# Patient Record
Sex: Male | Born: 2000 | Race: White | Hispanic: No | Marital: Single | Smoking: Never smoker
Health system: Southern US, Community
[De-identification: ages and names within clinical notes are randomized; demographics above are authoritative.]

## PROBLEM LIST (undated history)

## (undated) DIAGNOSIS — J45909 Unspecified asthma, uncomplicated: Secondary | ICD-10-CM

## (undated) DIAGNOSIS — M1990 Unspecified osteoarthritis, unspecified site: Secondary | ICD-10-CM

## (undated) HISTORY — DX: Unspecified asthma, uncomplicated: J45.909

---
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
## 2021-01-09 ENCOUNTER — Encounter: Payer: Self-pay | Admitting: Allergy

## 2021-01-09 ENCOUNTER — Other Ambulatory Visit: Payer: Self-pay

## 2021-01-09 ENCOUNTER — Ambulatory Visit (INDEPENDENT_AMBULATORY_CARE_PROVIDER_SITE_OTHER): Payer: BC Managed Care – PPO | Admitting: Allergy

## 2021-01-09 VITALS — BP 108/82 | HR 72 | Resp 16

## 2021-01-09 DIAGNOSIS — H6982 Other specified disorders of Eustachian tube, left ear: Secondary | ICD-10-CM | POA: Diagnosis not present

## 2021-01-09 DIAGNOSIS — L2089 Other atopic dermatitis: Secondary | ICD-10-CM | POA: Diagnosis not present

## 2021-01-09 DIAGNOSIS — J302 Other seasonal allergic rhinitis: Secondary | ICD-10-CM

## 2021-01-09 DIAGNOSIS — J4530 Mild persistent asthma, uncomplicated: Secondary | ICD-10-CM | POA: Diagnosis not present

## 2021-01-09 DIAGNOSIS — J3089 Other allergic rhinitis: Secondary | ICD-10-CM | POA: Diagnosis not present

## 2021-01-09 MED ORDER — QVAR REDIHALER 80 MCG/ACT IN AERB: INHALATION_SPRAY | RESPIRATORY_TRACT | 5 refills | Status: DC

## 2021-01-09 NOTE — Progress Notes (Signed)
Follow-up Note  RE: Jeffrey Duran MRN: [id number] DOB: 04-26-2001 Date of Office Visit: 01/09/2021   History of present illness: Jeffrey Duran is a 20 y.o. male presenting today for ear issues.  He was last in the office on 11/09/2020 by Dr. Dellis Anes for asthma, allergic rhinitis and eczema.  He states he has been having issues with his ears being stopped up and states can hear popping.  He feels it mostly in his left ear although occasionally involves the right ear.  Sometimes he states he can have ear pain and extends to his jaw.   He states the symptoms get better and worse.  He states symptoms ongoing for past 3 months.  He did do a "doc on demand" telemedicine visit for and went to an UC and both times got antibiotic and prednisone.  The ear symptoms did improve with use but returned after completion.  He does use nasal atrovent 1 spray each nostril twice a day and does seem to help some.  He thinks he was on flonase several years ago.   He has not seen ENT for these issues.   He also states is using albuterol about 2-3 times a week for chest tightness.  He takes qvar 40 2 puffs twice a day.  He will use triamcinolone as needed for eczema flare with good improvement.      Review of systems: Review of Systems  Constitutional: Negative.   HENT: Positive for ear pain. Negative for ear discharge, hearing loss and tinnitus.   Eyes: Negative.   Respiratory: Negative.   Cardiovascular: Negative.   Gastrointestinal: Negative.   Musculoskeletal: Negative.   Skin: Negative.   Neurological: Negative.     All other systems negative unless noted above in HPI  Past medical/social/surgical/family history have been reviewed and are unchanged unless specifically indicated below.  No changes  Medication List: Current Outpatient Medications  Medication Sig Dispense Refill  . albuterol (PROVENTIL) (2.5 MG/3ML) 0.083% nebulizer solution 2.5 mg.    . albuterol (VENTOLIN HFA) 108 (90 Base)  MCG/ACT inhaler Inhale 1-2 puffs into the lungs every 4 (four) hours as needed for wheezing or shortness of breath. 18 g 2  . cetirizine (ZYRTEC) 10 MG tablet Take 1 tablet (10 mg total) by mouth daily. 30 tablet 5  . ipratropium (ATROVENT) 0.06 % nasal spray Place 2 sprays into both nostrils 3 (three) times daily. 15 mL 5  . montelukast (SINGULAIR) 10 MG tablet TAKE 1 TABLET(10 MG) BY MOUTH DAILY 30 tablet 5  . triamcinolone ointment (KENALOG) 0.1 % Apply to affected area except face 2 times daily     No current facility-administered medications for this visit.     Known medication allergies: Allergies  Allergen Reactions  . Povidone Iodine Other (See Comments)     Physical examination: Blood pressure 108/82, pulse 72, resp. rate 16, SpO2 97 %.  General: Alert, interactive, in no acute distress. HEENT: PERRLA, TMs pearly gray, turbinates moderately edematous without discharge, post-pharynx non erythematous. Neck: Supple without lymphadenopathy. Lungs: Clear to auscultation without wheezing, rhonchi or rales. {no increased work of breathing. CV: Normal S1, S2 without murmurs. Abdomen: Nondistended, nontender. Skin: Warm and dry, without lesions or rashes. Extremities:  No clubbing, cyanosis or edema. Neuro:   Grossly intact.  Diagnositics/Labs: None today  Assessment and plan:   Eustachian tube dysfunction -Increase your nasal ipratropium nasal spray to 2 sprays in the morning, midday and at night until your symptoms have improved.  Once better  he can go back to twice a day use -Recommend performing nasal saline rinse at least once a day at this time.  Use this prior to using your nasal spray -If symptoms persist then you may need an ENT evaluation  Mild persistent asthma -At this time you are needing to use your albuterol a little too much to have good control. -Change Qvar 40 mcg to Qvar 80 mcg 2 puffs twice a day have access to albuterol inhaler 2 puffs every 4-6 hours as  needed for cough/wheeze/shortness of breath/chest tightness.  May use 15-20 minutes prior to activity.   Monitor frequency of use.    Asthma control goals:   Full participation in all desired activities (may need albuterol before activity)  Albuterol use two time or less a week on average (not counting use with activity)  Cough interfering with sleep two time or less a month  Oral steroids no more than once a year  No hospitalizations  Seasonal and perennial allergic rhinitis -Continue avoidance measures for grasses, weeds, trees, indoor molds, outdoor molds, dust mites, cat and dog - Continue with:  nasal ipratropium as above Singulair (montelukast) 10mg  daily Zyrtec (cetirizine) 10mg  tablet once daily - You can use an extra dose of the antihistamine, if needed, for breakthrough symptoms.  - Consider nasal saline rinses 1-2 times daily to remove allergens from the nasal cavities as well as help with mucous clearance (this is especially helpful to do before the nasal sprays are given) - Consider allergy shots as a means of long-term control. - Allergy shots "re-train" and "reset" the immune system to ignore environmental allergens and decrease the resulting immune response to those allergens (sneezing, itchy watery eyes, runny nose, nasal congestion, etc).    - Allergy shots improve symptoms in 75-85% of patients.    Flexural atopic dermatitis -Continue as needed use of triamcinolone for eczema flare - Continue with moisturizers twice daily as needed.  Return in about 3-4 months or sooner if needed   I appreciate the opportunity to take part in Jeffrey Duran's care. Please do not hesitate to contact me with questions.  Sincerely,   , MD Allergy/Immunology [facility]

## 2021-01-09 NOTE — Patient Instructions (Addendum)
Eustachian tube dysfunction -Increase your nasal ipratropium nasal spray to 2 sprays in the morning, midday and at night until your symptoms have improved.  Once better he can go back to twice a day use -Recommend performing nasal saline rinse at least once a day at this time.  Use this prior to using your nasal spray -If symptoms persist then you may need an ENT evaluation  Mild persistent asthma -At this time you are needing to use your albuterol a little too much to have good control. -Change Qvar 40 mcg to Qvar 80 mcg 2 puffs twice a day have access to albuterol inhaler 2 puffs every 4-6 hours as needed for cough/wheeze/shortness of breath/chest tightness.  May use 15-20 minutes prior to activity.   Monitor frequency of use.    Asthma control goals:   Full participation in all desired activities (may need albuterol before activity)  Albuterol use two time or less a week on average (not counting use with activity)  Cough interfering with sleep two time or less a month  Oral steroids no more than once a year  No hospitalizations  Seasonal and perennial allergic rhinitis -Continue avoidance measures for grasses, weeds, trees, indoor molds, outdoor molds, dust mites, cat and dog - Continue with:  nasal ipratropium as above Singulair (montelukast) 10mg  daily Zyrtec (cetirizine) 10mg  tablet once daily - You can use an extra dose of the antihistamine, if needed, for breakthrough symptoms.  - Consider nasal saline rinses 1-2 times daily to remove allergens from the nasal cavities as well as help with mucous clearance (this is especially helpful to do before the nasal sprays are given) - Consider allergy shots as a means of long-term control. - Allergy shots "re-train" and "reset" the immune system to ignore environmental allergens and decrease the resulting immune response to those allergens (sneezing, itchy watery eyes, runny nose, nasal congestion, etc).    - Allergy shots improve  symptoms in 75-85% of patients.    Flexural atopic dermatitis -Continue as needed use of triamcinolone for eczema flare - Continue with moisturizers twice daily as needed.  Return in about 3-4 months or sooner if needed

## 2021-01-16 IMAGING — CR DG FOOT COMPLETE 3+V*L*
3 series · 3 of 3 positions shown · non-contrast
Comparison: None.

CLINICAL DATA: Puncture wound to foot.  Stepped on nail.

EXAM:
LEFT FOOT - COMPLETE 3+ VIEW

[t foot ap left]
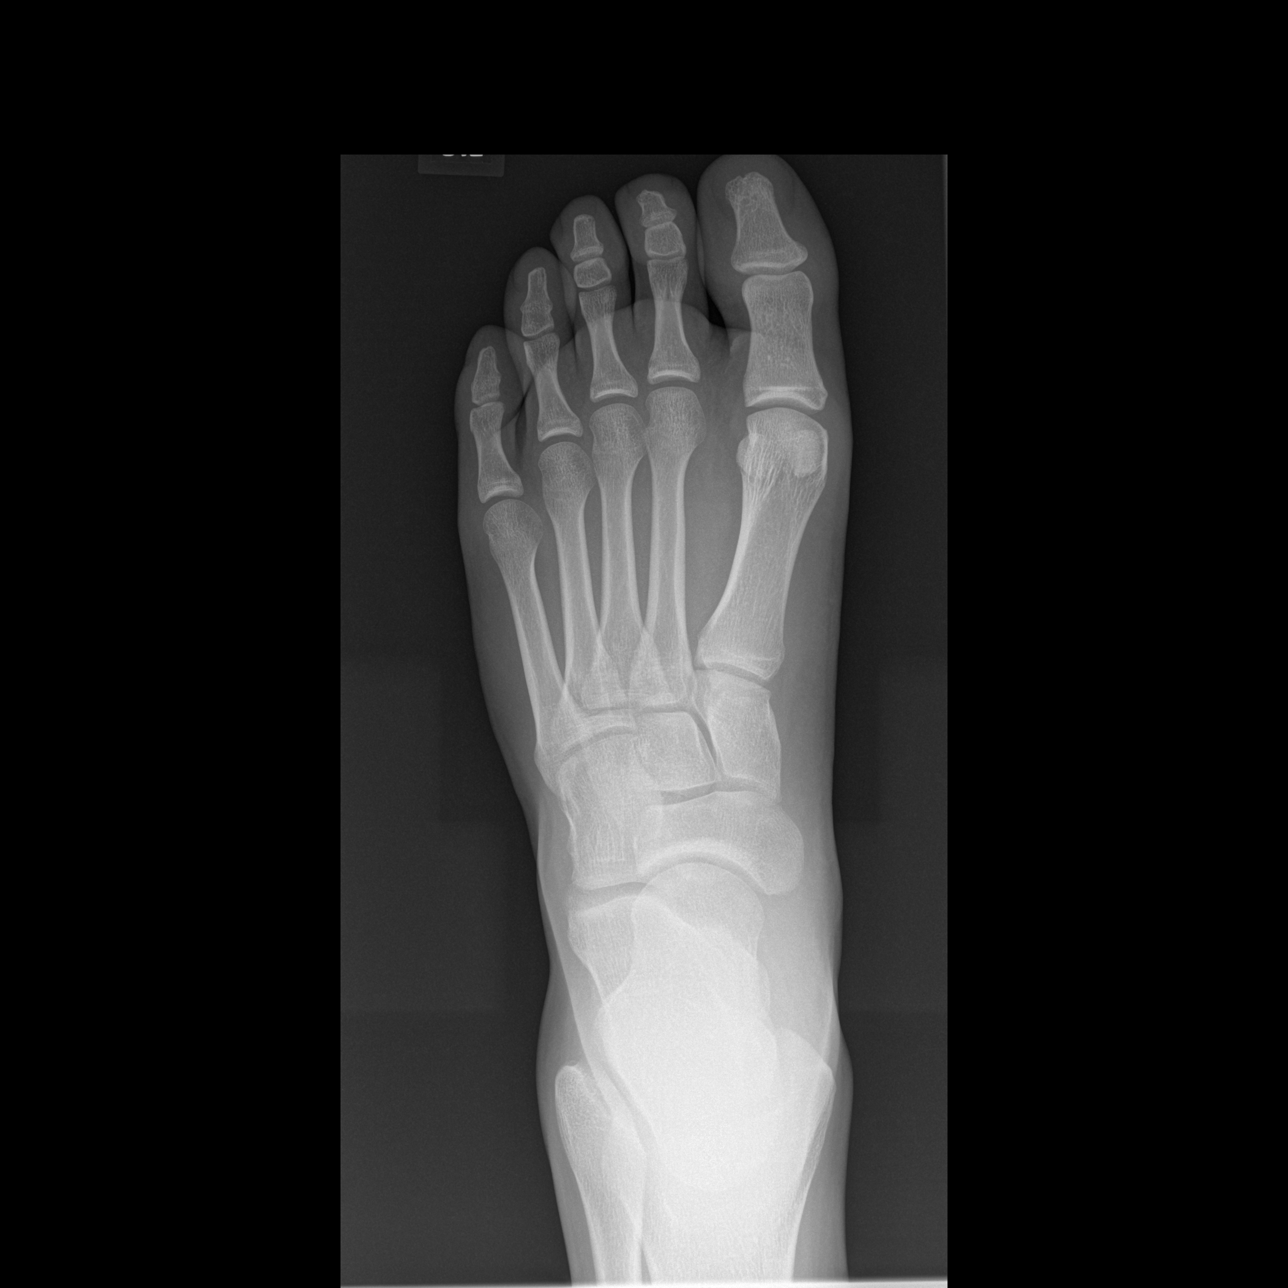

[t foot oblique left]
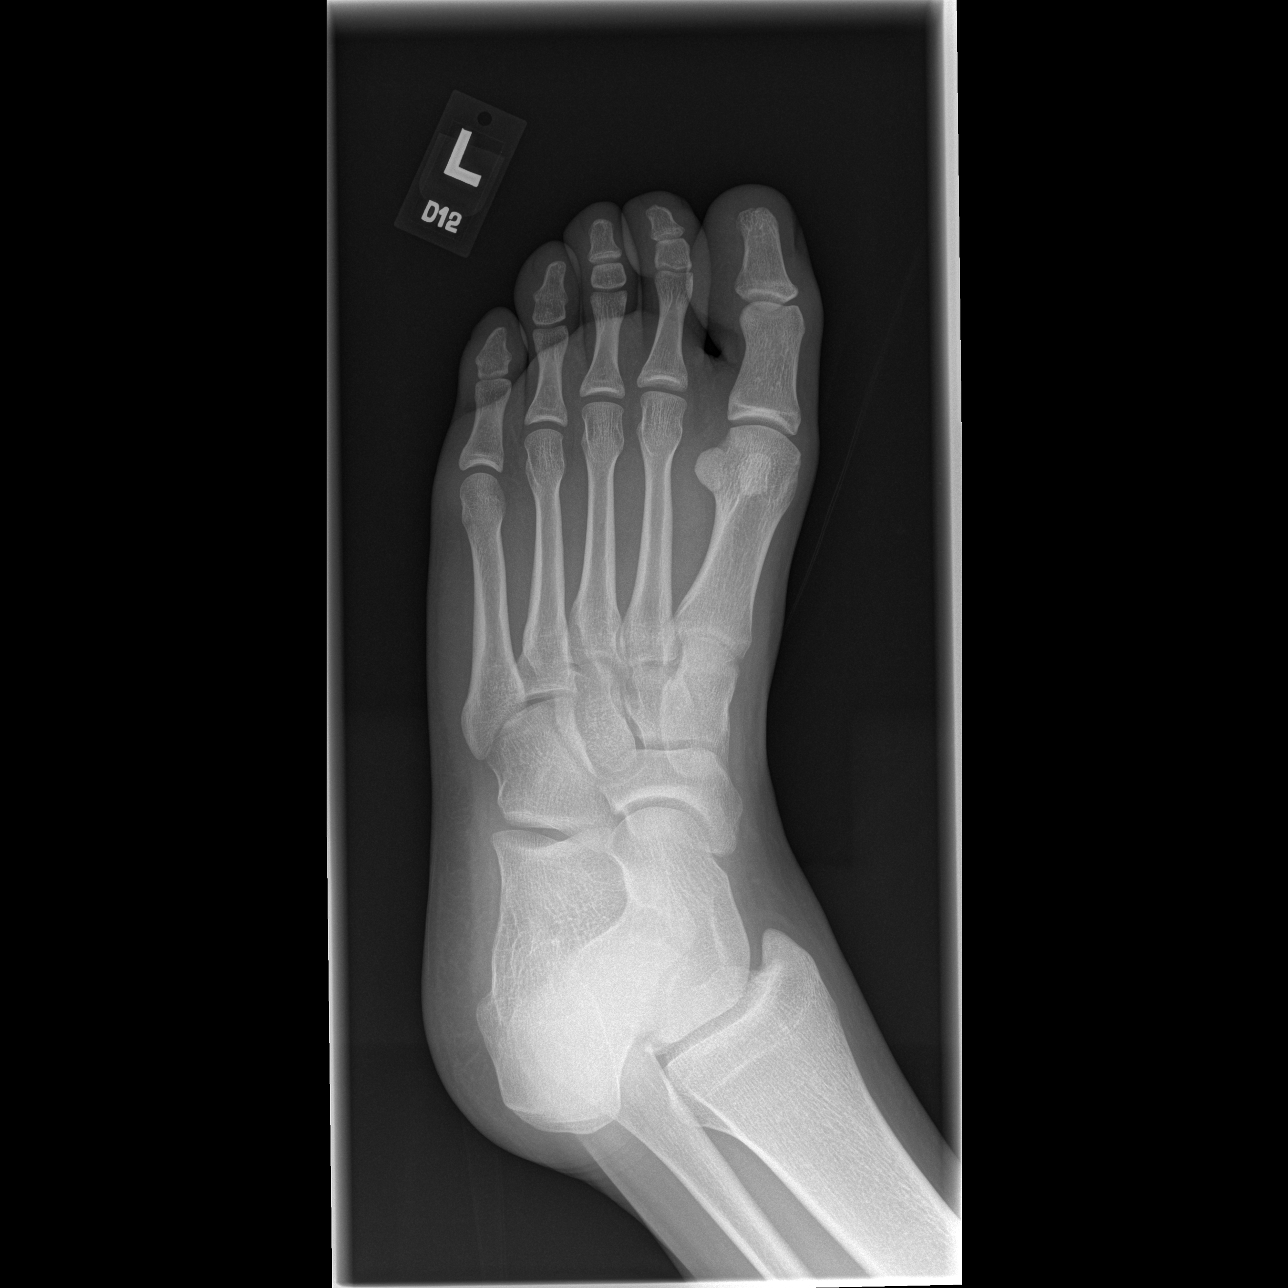

[t foot lat left]
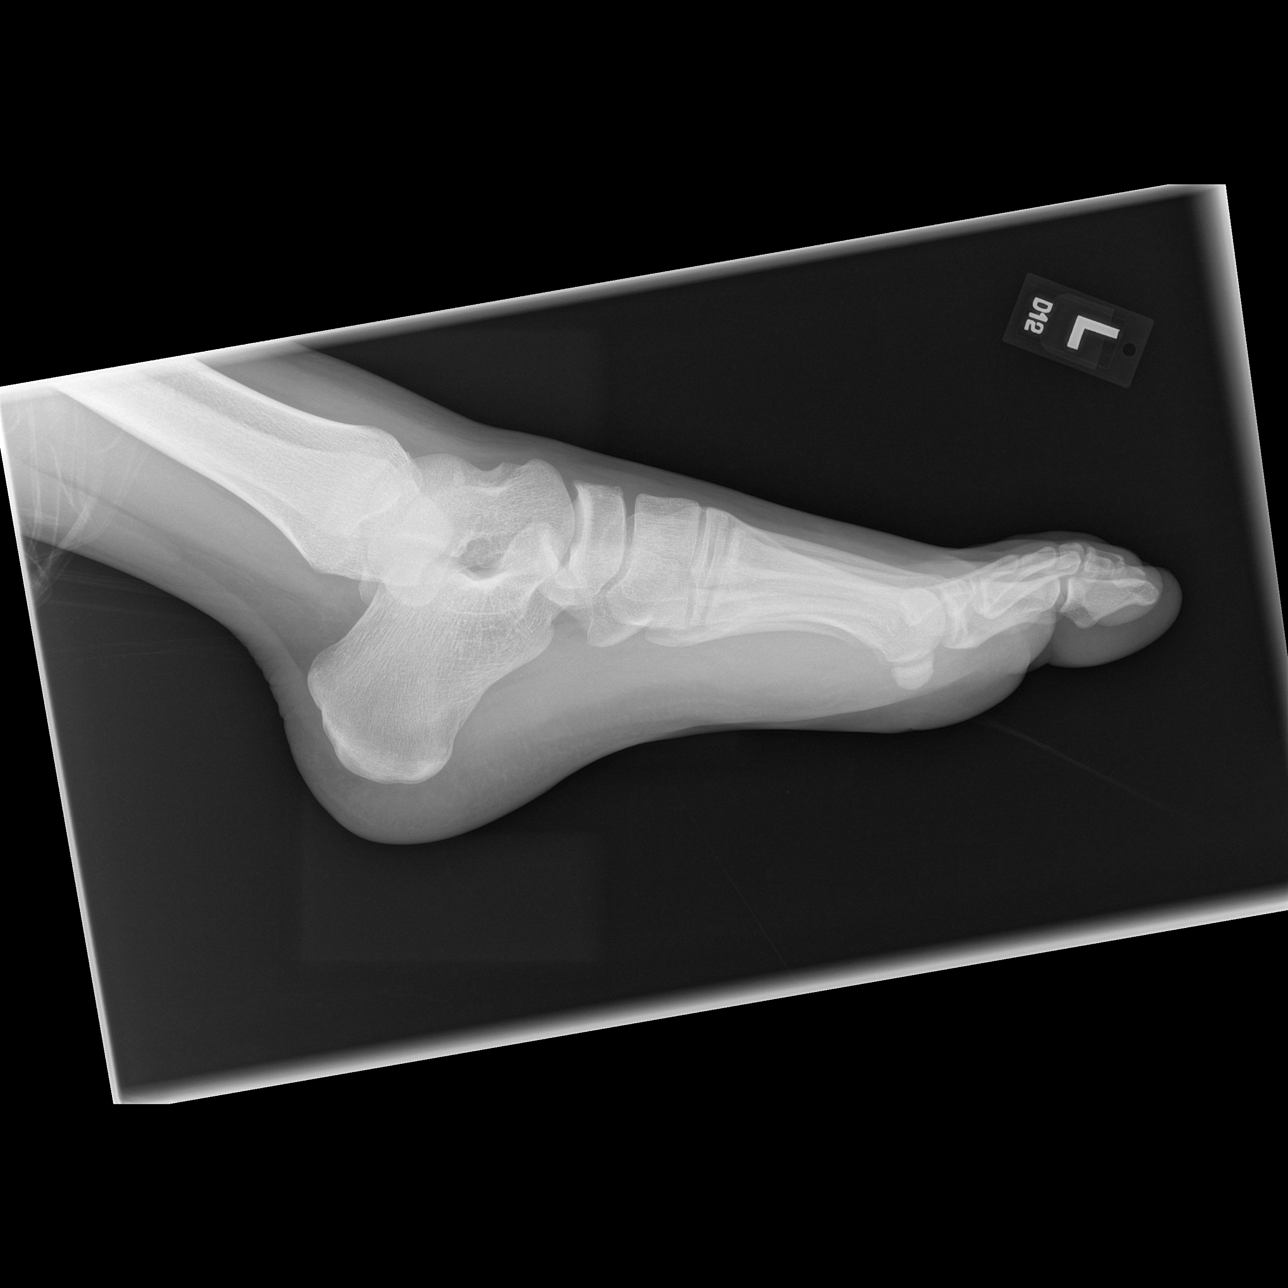

[3 of 3 positions shown; findings below may reference images not displayed]

FINDINGS: There is no evidence of fracture or dislocation. There is no
evidence of arthropathy or other focal bone abnormality. Soft
tissues are unremarkable.
IMPRESSION: Negative.

## 2021-02-12 ENCOUNTER — Ambulatory Visit: Payer: BC Managed Care – PPO | Admitting: Allergy & Immunology

## 2021-02-19 ENCOUNTER — Other Ambulatory Visit: Payer: Self-pay | Admitting: *Deleted

## 2021-02-19 MED ORDER — QVAR REDIHALER 80 MCG/ACT IN AERB: INHALATION_SPRAY | RESPIRATORY_TRACT | 1 refills | Status: DC

## 2021-02-20 ENCOUNTER — Other Ambulatory Visit: Payer: Self-pay

## 2021-02-20 MED ORDER — IPRATROPIUM BROMIDE 0.06 % NA SOLN: 2.0000 | Freq: Three times a day (TID) | NASAL | 2 refills | Status: DC

## 2021-06-15 ENCOUNTER — Other Ambulatory Visit: Payer: Self-pay | Admitting: Allergy

## 2021-07-04 ENCOUNTER — Telehealth: Payer: Self-pay | Admitting: Allergy

## 2021-07-04 ENCOUNTER — Other Ambulatory Visit: Payer: Self-pay

## 2021-07-04 MED ORDER — ALBUTEROL SULFATE HFA 108 (90 BASE) MCG/ACT IN AERS: 1.0000 | INHALATION_SPRAY | RESPIRATORY_TRACT | 0 refills | Status: DC | PRN

## 2021-07-04 MED ORDER — CETIRIZINE HCL 10 MG PO TABS: 10.0000 mg | ORAL_TABLET | Freq: Every day | ORAL | 0 refills | Status: DC

## 2021-07-04 NOTE — Telephone Encounter (Signed)
Courtesy refill sent in for zyrtec and albuterol,, trying calling and was able to leave a VM.

## 2021-07-04 NOTE — Telephone Encounter (Signed)
Pt mom states she called the pharmacy to refill his cetirizine and they said there are no more refills, she would like to get that filled and possibly the albuterol as well.

## 2021-08-02 ENCOUNTER — Other Ambulatory Visit: Payer: Self-pay | Admitting: Allergy & Immunology

## 2021-08-07 ENCOUNTER — Telehealth: Payer: Self-pay

## 2021-08-07 NOTE — Telephone Encounter (Signed)
Spoke to mom to let her know that we were unable to refill montelukast 10mg , albuterol which was done last month as a courtesy refill,ipratropium 0.06% for Zailen. Medications will be refilled when patient keeps his appointment that was made today for 08/14/2021 at 9:30 am with Chrissie. I offered appointment today, but mom stated [place] was [place]. Mom was fine with him waiting till next week.

## 2021-08-13 ENCOUNTER — Other Ambulatory Visit: Payer: Self-pay

## 2021-08-13 NOTE — Telephone Encounter (Signed)
Denied refills on  atrovent,albuterol hfa, montelukast 10 mg. Patient has an appointment tomorrow will do refills after patient keeps his appointment.

## 2021-08-14 ENCOUNTER — Ambulatory Visit (INDEPENDENT_AMBULATORY_CARE_PROVIDER_SITE_OTHER): Payer: BC Managed Care – PPO | Admitting: Internal Medicine

## 2021-08-14 ENCOUNTER — Telehealth: Payer: Self-pay | Admitting: *Deleted

## 2021-08-14 ENCOUNTER — Encounter: Payer: Self-pay | Admitting: Internal Medicine

## 2021-08-14 ENCOUNTER — Other Ambulatory Visit: Payer: Self-pay

## 2021-08-14 VITALS — BP 108/68 | HR 84 | Temp 99.1°F | Resp 16

## 2021-08-14 DIAGNOSIS — H6982 Other specified disorders of Eustachian tube, left ear: Secondary | ICD-10-CM

## 2021-08-14 DIAGNOSIS — J4530 Mild persistent asthma, uncomplicated: Secondary | ICD-10-CM

## 2021-08-14 DIAGNOSIS — J3089 Other allergic rhinitis: Secondary | ICD-10-CM

## 2021-08-14 DIAGNOSIS — L2089 Other atopic dermatitis: Secondary | ICD-10-CM | POA: Diagnosis not present

## 2021-08-14 DIAGNOSIS — J302 Other seasonal allergic rhinitis: Secondary | ICD-10-CM

## 2021-08-14 MED ORDER — TRIAMCINOLONE ACETONIDE 0.1 % EX OINT: TOPICAL_OINTMENT | Freq: Two times a day (BID) | CUTANEOUS | 1 refills | Status: DC

## 2021-08-14 MED ORDER — MONTELUKAST SODIUM 10 MG PO TABS: ORAL_TABLET | ORAL | 1 refills | Status: DC

## 2021-08-14 MED ORDER — CETIRIZINE HCL 10 MG PO TABS: 10.0000 mg | ORAL_TABLET | Freq: Every day | ORAL | 3 refills | Status: DC

## 2021-08-14 MED ORDER — EPINEPHRINE 0.3 MG/0.3ML IJ SOAJ: 0.3000 mg | INTRAMUSCULAR | 2 refills | Status: DC | PRN

## 2021-08-14 NOTE — Patient Instructions (Addendum)
Dysfunction of left eustachian tube - Possible causes include your allergic rhinitis, however we are controlling this and you are still having symptoms.   -Will refer to ENT for further evaluation.  -Continue Ipatroprium 2 sprays per nostril twice a day  -Continue Nasal saline spray prior to ipatropium   Mild Persistent Asthma: well controlled  - Breathing test today showed: Looked great! - Based on symptoms and breathing tests your asthma is well controlled and we need to step down treatment   PLAN: . - Daily controller medication(s): Qvar 1 puff twice daily  - Prior to physical activity: albuterol 2 puffs 10-15 minutes before physical activity. - Rescue medications: albuterol 4 puffs every 4-6 hours as needed - Get Influenza Vaccine and appropriate Pneumonia and COVID 19 boosters  - Asthma control goals:  * Full participation in all desired activities (may need albuterol before activity) * Albuterol use two time or less a week on average (not counting use with activity) * Cough interfering with sleep two time or less a month * Oral steroids no more than once a year * No hospitalizations   Seasonal and perennial allergic rhinitis - Continue with: Zyrtec (cetirizine) 10mg  tablet once daily and Singulair (montelukast) 10mg  daily - You can use an extra dose of the antihistamine, if needed, for breakthrough symptoms.  - Consider nasal saline rinses 1-2 times daily to remove allergens from the nasal cavities as well as help with mucous clearance (this is especially helpful to do before the nasal sprays are given) - Plan to start Allergy Shots    Starting Allergy Shots  Had a detailed discussion with patient/family that clinical history is suggestive of allergic rhinitis, and may benefit from allergy immunotherapy (AIT). Discussed in detail regarding the dosing, schedule, side effects (mild to moderate local allergic reaction and rarely systemic allergic reactions including  anaphylaxis/death), alternatives and benefits (significant improvement in nasal symptoms, seasonal flares of asthma) of immunotherapy with the patient. There is significant time commitment involved with allergy shots, which includes weekly immunotherapy injections for first 9-12 months and then biweekly to monthly injections for 3-5 years. Clinical response is often delayed and patient may not see an improvement for 6-12 months. Consent was signed. I have prescribed epinephrine injectable and demonstrated proper use. For mild symptoms you can take over the counter antihistamines such as Benadryl and monitor symptoms closely. If symptoms worsen or if you have severe symptoms including breathing issues, throat closure, significant swelling, whole body hives, severe diarrhea and vomiting, lightheadedness then inject epinephrine and seek immediate medical care afterwards. Action plan given.  Flexural atopic dermatitis : well controlled  - Continue Triamcinolone 0.1% ointment as needed for flares  - Continue daily moistrurizers twice daily as needed   Follow up for allergy shots and with me in 3 months   Thank you so much for letter me partake in your care today.  Don't hesitate to reach out if you have any additional concerns!  , MD  [facility] [place] [place]

## 2021-08-14 NOTE — Progress Notes (Signed)
FOLLOW UP Date of Service/Encounter:  08/14/21   Subjective:  Jeffrey Duran (DOB: 30-Mar-2001) is a 20 y.o. male who [facility] in re-evaluation of the following: asthma, allergic rhinitis and eczema. History obtained from: chart review and patient.  For Review, LV was on 01/09/21  with Dr. Delorse Lek seen for eustachian tube dysfunction as well as asthma, allergic rhinitis and eczema..    Today is a regular follow up visit.  Asthma:  At last visit he was stepped up to Qvar 2 puffs daily due to poor control.  Since then he reports:  -0 per week daytime symptoms in past month, 0 per week nighttime awakenings in past month,  -Currently using rescue inhaler Denies any recent use -Limitations to daily activity: none - 0 ED visit, 0 UC visits 0 hospitalizations since last visit - 0 oral steroids and 0 antibiotics for airway illness since last visit. -Up-to-date with Covid-19 vaccines.  Denies flu vaccne  -Smoking exposure: denies  -Today's Asthma Control Test:  .  25/25   -Current Regimen: qvar 2 puffs twice  -Previous FEV1 % 4.48L 96%  at last visit -Total corticosteroid use in past year 0 -Adverse effects of medications : none -Dexa and Cataract Screening: not indicated  2) Seasonal and perennial  Rhinitis: current therapy: nasal atrovent, singulair 10mg  daily, zyrtec 10mg  daily  symptoms partially improved symptoms include:  mild sneezing, otherwise controlled with the medication Previous allergy testing: yes : grasses, weeds, trees, indoor molds, outdoor molds, dust mites, cat and dog History of reflux/heartburn: no Interested in Allergy Immunotherapy: YES  3)  Eustachian Tube dysfunction: reports persistent symptoms,  some improvement with nasal ipatropium but when he stopped the nasal spray symptoms recurs.  Denies any associated fevers, sinus pressure, throat pain.  Has difficult equalizing.  Denies any reflux or heartburn  symptoms .  Has not seen ENT   Allergies as of 08/14/2021       Reactions   Povidone Iodine Other (See Comments)        Medication List        Accurate as of August 14, 2021  8:57 AM. If you have any questions, ask your nurse or doctor.          albuterol (2.5 MG/3ML) 0.083% nebulizer solution Commonly known as: PROVENTIL 2.5 mg.   albuterol 108 (90 Base) MCG/ACT inhaler Commonly known as: VENTOLIN HFA Inhale 1-2 puffs into the lungs every 4 (four) hours as needed for wheezing or shortness of breath.   cetirizine 10 MG tablet Commonly known as: ZYRTEC TAKE 1 TABLET(10 MG) BY MOUTH DAILY   ipratropium 0.06 % nasal spray Commonly known as: ATROVENT USE 2 SPRAYS IN EACH NOSTRIL THREE TIMES DAILY   montelukast 10 MG tablet Commonly known as: SINGULAIR TAKE 1 TABLET(10 MG) BY MOUTH DAILY   Qvar RediHaler 80 MCG/ACT inhaler Generic drug: beclomethasone Inhale two puffs twice daily to prevent cough or wheeze. Rinse mouth after use.   triamcinolone ointment 0.1 % Commonly known as: KENALOG Apply to affected area except face 2 times daily       Past Medical History:  Diagnosis Date   Arthritis    Asthma    No past surgical history on file. Otherwise, there have been no changes to his past medical history, surgical history, family history, or social history.  ROS: All others negative except as noted per HPI.   Objective:  BP 108/68    Pulse  84    Temp 99.1 F (37.3 C) (Temporal)    Resp 16    SpO2 96%  There is no height or weight on file to calculate BMI. Physical Exam: General Appearance:  Alert, cooperative, no distress, appears stated age  Head:  Normocephalic, without obvious abnormality, atraumatic  HEENT  Conjunctiva clear, EOM's intact, TM- intact bilaterally,  Cerumen bilaterally,  nasal mucosa erythematous with clear rhinorhea, Turbinates enlarged bilaterally,    Nasal polyposis not noted on limited external exam   Throat: Lips, tongue normal;  teeth and gums normal, oral mucosa normal without exudates, posterior pharyngeal cobblestoning not noted  Neck: Supple, symmetrical  Lungs:   Respirations unlabored, no coughing, Breath Sounds bilaterally, no wheeze, crackles or rales  Heart:  Appears well perfused, S1 S2 normal, no murmurs, rubs or gallops, regular rate or rhythm  Extremities: No edema  Skin: Skin color, texture, turgor normal, no rashes or lesions on visualized portions of skin  Neurologic: No gross deficits   Reviewed: Previous allergy encounters, testing, PFTS, allergy pertinent laboratory and radiographic data   I reviewed his past medical history, social history, family history, and environmental history and no significant changes have been reported from his previous visit.  Spirometry:  Tracings reviewed. His effort: Good reproducible efforts. FVC: 4.98L FEV1: 3.62L, 76% predicted FEV1/FVC ratio: 73% Interpretation: Spirometry consistent with normal pattern.  Please see scanned spirometry results for details.  Assessment:  Dysfunction of left eustachian tube - Plan: Spirometry with Graph  Mild persistent asthma, uncomplicated - Plan: Spirometry with Graph  Seasonal and perennial allergic rhinitis - Plan: Spirometry with Graph  Flexural atopic dermatitis - Plan: Spirometry with Graph   Plan/Recommendations:   Patient Instructions  Dysfunction of left eustachian tube - Possible causes include your allergic rhinitis, however we are controlling this and you are still having symptoms.   -Will refer to ENT for further evaluation.  -Continue Ipatroprium 2 sprays per nostril twice a day  -Continue Nasal saline spray prior to ipatropium   Mild Persistetn  Asthma: well controlled  - Breathing test today showed: Looked great! - Based on symptoms and breathing tests your asthma is well controlled and we need to step down treatment   PLAN: . - Daily controller medication(s): Qvar 1 puff twice daily  -  Prior to physical activity: albuterol 2 puffs 10-15 minutes before physical activity. - Rescue medications: albuterol 4 puffs every 4-6 hours as needed - Get Influenza Vaccine and appropriate Pneumonia and COVID 19 boosters  - Asthma control goals:  * Full participation in all desired activities (may need albuterol before activity) * Albuterol use two time or less a week on average (not counting use with activity) * Cough interfering with sleep two time or less a month * Oral steroids no more than once a year * No hospitalizations   Seasonal and perennial allergic rhinitis - Continue with: Zyrtec (cetirizine) 10mg  tablet once daily and Singulair (montelukast) 10mg  daily - You can use an extra dose of the antihistamine, if needed, for breakthrough symptoms.  - Consider nasal saline rinses 1-2 times daily to remove allergens from the nasal cavities as well as help with mucous clearance (this is especially helpful to do before the nasal sprays are given) - Plan to start Allergy Shots    Starting Allergy Shots  Had a detailed discussion with patient/family that clinical history is suggestive of allergic rhinitis, and may benefit from allergy immunotherapy (AIT). Discussed in detail regarding the dosing, schedule, side  effects (mild to moderate local allergic reaction and rarely systemic allergic reactions including anaphylaxis/death), alternatives and benefits (significant improvement in nasal symptoms, seasonal flares of asthma) of immunotherapy with the patient. There is significant time commitment involved with allergy shots, which includes weekly immunotherapy injections for first 9-12 months and then biweekly to monthly injections for 3-5 years. Clinical response is often delayed and patient may not see an improvement for 6-12 months. Consent was signed. I have prescribed epinephrine injectable and demonstrated proper use. For mild symptoms you can take over the counter antihistamines such as  Benadryl and monitor symptoms closely. If symptoms worsen or if you have severe symptoms including breathing issues, throat closure, significant swelling, whole body hives, severe diarrhea and vomiting, lightheadedness then inject epinephrine and seek immediate medical care afterwards. Action plan given.  Flexural atopic dermatitis : well controlled  - Continue Triamcinolone 0.1% ointment as needed for flares  - Continue daily moistrurizers twice daily as needed   Follow up for allergy shots and with me in 3 months   Thank you so much for letter me partake in your care today.  Don't hesitate to reach out if you have any additional concerns!  Ferol Luz, MD  [facility] [place], [place]

## 2021-08-14 NOTE — Telephone Encounter (Signed)
Please refer to ENT per Dr. [name] for Dysfunction of left eustachian tube.

## 2021-08-16 NOTE — Progress Notes (Signed)
Aeroallergen Immunotherapy   Ordering Provider: Dr. Ferol Luz   Patient Details  Name: Jeffrey Duran  MRN: [id number]  Date of Birth: 10-26-2000   Order 1 of 1   Vial Label: Trees, grass, dog, horse  0.3 ml (Volume)  BAU Concentration -- 7 Grass Mix* 100,000 ([address] [place], [place], [place], Perennial [place], [place], [place], [place])  0.2 ml (Volume)  1:20 Concentration -- [place]  0.3 ml (Volume)  1:10 Concentration -- Oak, [place] mix*  0.2 ml (Volume)  1:10 Concentration -- Pecan Pollen  0.5 ml (Volume)  1:10 Concentration -- Dog Epithelia  0.3 ml (Volume)  1:10 Concentration -- Horse Epithelia    1.8  ml Extract Subtotal  3.2 ml Diluent  5.0  ml Maintenance Total   Schedule:  A  Blue Vial (1:100,000): Schedule B (6 doses)  Yellow Vial (1:10,000): Schedule B (6 doses)  Green Vial (1:1,000): Schedule B (6 doses)  Red Vial (1:100): Schedule A (10 doses)   Special Instructions: none

## 2021-08-16 NOTE — Progress Notes (Signed)
Aeroallergen Immunotherapy   Ordering Provider: Dr. Ferol Luz   Patient Details  Name: Jeffrey Duran  MRN: [id number]  Date of Birth: February 25, 2001   Order 1 of 2   Vial Label: cat, mold   0.2 ml (Volume)  1:20 Concentration -- Alternaria alternata  0.2 ml (Volume)  1:20 Concentration -- Bipolaris sorokiniana  0.5 ml (Volume)  1:10 Concentration -- Cat Hair    0.9  ml Extract Subtotal  4.1  ml Diluent  5.0  ml Maintenance Total   Schedule:  A  Blue Vial (1:100,000): Schedule B (6 doses)  Yellow Vial (1:10,000): Schedule B (6 doses)  Green Vial (1:1,000): Schedule B (6 doses)  Red Vial (1:100): Schedule A (10 doses)   Special Instructions: none

## 2021-08-16 NOTE — Progress Notes (Signed)
Aeroallergen Immunotherapy   Ordering Provider: Dr. Ferol Luz   Patient Details  Name: Jeffrey Duran  MRN: [id number]  Date of Birth: Oct 20, 2000   Order 1 of 1   Vial Label: Trees, grass, dog, horse, mouse   0.3 ml (Volume)  BAU Concentration -- 7 Grass Mix* 100,000 ([address] [place], [place], [place], Perennial [place], [place], [place], [place])  0.2 ml (Volume)  1:20 Concentration -- [place]  0.3 ml (Volume)  1:10 Concentration -- Oak, [place] mix*  0.2 ml (Volume)  1:10 Concentration -- Pecan Pollen  0.5 ml (Volume)  1:10 Concentration -- Dog Epithelia  0.3 ml (Volume)  1:10 Concentration -- Horse Epithelia  0.2 ml (Volume)  1:20 Concentration -- Mouse     2.2  ml Extract Subtotal  2.8  ml Diluent  5.0  ml Maintenance Total   Schedule:  A  Blue Vial (1:100,000): Schedule B (6 doses)  Yellow Vial (1:10,000): Schedule B (6 doses)  Green Vial (1:1,000): Schedule B (6 doses)  Red Vial (1:100): Schedule A (10 doses)   Special Instructions: none

## 2021-08-16 NOTE — Progress Notes (Deleted)
Vials not made. Appt not sched.

## 2021-08-17 ENCOUNTER — Other Ambulatory Visit: Payer: Self-pay

## 2021-08-17 MED ORDER — ALBUTEROL SULFATE HFA 108 (90 BASE) MCG/ACT IN AERS: 1.0000 | INHALATION_SPRAY | RESPIRATORY_TRACT | 0 refills | Status: DC | PRN

## 2021-08-17 MED ORDER — IPRATROPIUM BROMIDE 0.06 % NA SOLN: NASAL | 1 refills | Status: DC

## 2021-08-17 NOTE — Telephone Encounter (Signed)
Patient has been scheduled with [facility]  [address] [place], [place] [address] [phone]  He is scheduled for 09/19/2021 @ 3 PM Dr Donnalee Curry.  I called and left a detailed voicemail for the patient with this information. They are going to send out a new patient packet.

## 2021-10-29 ENCOUNTER — Other Ambulatory Visit: Payer: Self-pay | Admitting: Allergy

## 2021-11-20 ENCOUNTER — Ambulatory Visit: Payer: BC Managed Care – PPO | Admitting: Internal Medicine

## 2021-12-11 ENCOUNTER — Encounter: Payer: Self-pay | Admitting: Internal Medicine

## 2021-12-11 ENCOUNTER — Ambulatory Visit (INDEPENDENT_AMBULATORY_CARE_PROVIDER_SITE_OTHER): Payer: BC Managed Care – PPO | Admitting: Internal Medicine

## 2021-12-11 VITALS — BP 118/68 | HR 81 | Temp 98.1°F | Resp 18 | Ht 71.5 in | Wt 221.9 lb

## 2021-12-11 DIAGNOSIS — J3089 Other allergic rhinitis: Secondary | ICD-10-CM

## 2021-12-11 DIAGNOSIS — J302 Other seasonal allergic rhinitis: Secondary | ICD-10-CM

## 2021-12-11 DIAGNOSIS — J4530 Mild persistent asthma, uncomplicated: Secondary | ICD-10-CM | POA: Diagnosis not present

## 2021-12-11 DIAGNOSIS — L2089 Other atopic dermatitis: Secondary | ICD-10-CM

## 2021-12-11 MED ORDER — MOMETASONE FUROATE 50 MCG/ACT NA SUSP: 2.0000 | Freq: Every day | NASAL | 12 refills | Status: DC

## 2021-12-11 NOTE — Patient Instructions (Addendum)
Mild Persistent  Asthma: well controlled  ?- Breathing test today showed:  ?- Based on symptoms and breathing tests your asthma is well controlled and we need to step down treatment  ?  ?PLAN: . ?- Daily controller medication(s): Qvar 73mcg 1 puff twice daily  ?- Prior to physical activity: albuterol 2 puffs 10-15 minutes before physical activity. ?- Rescue medications: albuterol 4 puffs every 4-6 hours as needed ?- Get Influenza Vaccine and appropriate Pneumonia and COVID 19 boosters  ?- Asthma control goals:  ?* Full participation in all desired activities (may need albuterol before activity) ?* Albuterol use two time or less a week on average (not counting use with activity) ?* Cough interfering with sleep two time or less a month ?* Oral steroids no more than once a year ?* No hospitalizations ?  ?  ?Seasonal and perennial allergic rhinitis:  ?- Continue with: Zyrtec (cetirizine) 10mg  tablet once daily and Singulair (montelukast) 10mg  daily ?- Restart Nasonex  ?- You can use an extra dose of the antihistamine, if needed, for breakthrough symptoms.  ?- Consider nasal saline rinses 1-2 times daily to remove allergens from the nasal cavities as well as help with mucous clearance (this is especially helpful to do before the nasal sprays are given  ? ?  ?Flexural atopic dermatitis : well controlled  ?- Continue Triamcinolone 0.1% ointment as needed for flares  ?- Continue daily moistrurizers twice daily as needed  ?

## 2021-12-11 NOTE — Progress Notes (Signed)
? ?Follow Up Note ? ?RE: Jeffrey Duran MRN: [id number] DOB: [birth year] ?Date of Office Visit: 12/11/2021 ? ?Referring provider: [facility] ?Primary care provider: [facility] ? ?Chief Complaint: Follow-up (Patient is present for a check up on allergy and asthma. Patient mention allergy and asthma symptoms are well managed with treatment./ ), Asthma, and Allergic Rhinitis  ? ?History of Present Illness: ?I had the pleasure of seeing Jeffrey Duran for a follow up visit at the [facility] on 12/11/2021. He is a 21 y.o. male, who is being followed for asthma, allergic rhinitis, eczema, eustachian tube dysfunction. His previous allergy office visit was on 08/14/2021 with Dr. [name]. Today is a regular follow up visit.  ? ?ASTHMA ?- Medical therapy: Qvar 80 mcg 1 puff twice a day, Singulair 10 mg daily ?- Rescue inhaler use: no use several month ?- Symptoms: denies cough, wheeze, dyspnea;  feels like asthma is well controlled  ?- Exacerbation history: 0 ABX for respiratory illness since last visit, 0 OCS, 0ED, 0 UC visits in the past year  ?- ACT: 24 /25 ?- Adverse effects of medication: denies  ?- Previous FEV1: 3.62 L, 76% ? ?Allergic rhinitis: current therapy: Zyrtec 10 mg daily, Singulair 10 mg daily,  ?symptoms not improved; worse nasal congestion over the past few weeks  ?symptoms include: nasal congestion, rhinorrhea, post nasal drainage, and sneezing ?Previous allergy testing: yes grasses, weeds, trees, indoor molds, outdoor molds, dust mites, cat and dog ?Interested in Allergy Immunotherapy:  Yes vials have been mixed but he has not started immunotherapy yet ? ?He has been evaluated by ENT for his persistent left eustachian tube dysfunction.  He was treated for otitis externa and switched from nasal Atrovent to Nasonex.  Today he reports significant improvement in symptoms.   ? ?Atopic dermatitis: flares mostly arms, neck  ?-current regimen triamcinolone 0.1% ointment for  flares which resolves flares within a couple nights  ? ? ?Assessment and Plan: ?Jeffrey Duran is a 21 y.o. male with: ?Mild persistent asthma, uncomplicated - Plan: Spirometry with Graph ? ?Seasonal and perennial allergic rhinitis ? ?Flexural atopic dermatitis ?Plan: ?Patient Instructions  ?Mild Persistent  Asthma: well controlled  ?- Breathing test today showed:  ?- Based on symptoms and breathing tests your asthma is well controlled and we need to step down treatment  ?  ?PLAN: . ?- Daily controller medication(s): Qvar 1 puff twice daily  ?- Prior to physical activity: albuterol 2 puffs 10-15 minutes before physical activity. ?- Rescue medications: albuterol 4 puffs every 4-6 hours as needed ?- Get Influenza Vaccine and appropriate Pneumonia and COVID 19 boosters  ?- Asthma control goals:  ?* Full participation in all desired activities (may need albuterol before activity) ?* Albuterol use two time or less a week on average (not counting use with activity) ?* Cough interfering with sleep two time or less a month ?* Oral steroids no more than once a year ?* No hospitalizations ?  ?  ?Seasonal and perennial allergic rhinitis:  ?- Continue with: Zyrtec (cetirizine) 10mg  tablet once daily and Singulair (montelukast) 10mg  daily ?- Restart Nasonex  ?- You can use an extra dose of the antihistamine, if needed, for breakthrough symptoms.  ?- Consider nasal saline rinses 1-2 times daily to remove allergens from the nasal cavities as well as help with mucous clearance (this is especially helpful to do before the nasal sprays are given  ? ?  ?Flexural atopic dermatitis : well controlled  ?- Continue Triamcinolone  0.1% ointment as needed for flares  ?- Continue daily moistrurizers twice daily as needed  ?Return in about 4 weeks (around 01/08/2022) for initially allergy shot injection . ? ?Meds ordered this encounter  ?Medications  ? mometasone (NASONEX) 50 MCG/ACT nasal spray  ?  Sig: Place 2 sprays into the nose daily.  ?   Dispense:  1 each  ?  Refill:  12  ? ? ?Lab Orders  ?No laboratory test(s) ordered today  ? ?Diagnostics: ?Spirometry:  ?Tracings reviewed. His effort: Positive to grass, tree, dog, horse ?FVC: 4.62 L ?FEV1: 4.16L, 87% predicted ?FEV1/FVC ratio: 90% ?Interpretation: Spirometry consistent with normal pattern.  ?Please see scanned spirometry results for details. ? ?Medication List:  ?Current Outpatient Medications  ?Medication Sig Dispense Refill  ? acetic acid-hydrocortisone (VOSOL-HC) OTIC solution SMARTSIG:Left Ear    ? albuterol (PROVENTIL) (2.5 MG/3ML) 0.083% nebulizer solution 2.5 mg.    ? albuterol (VENTOLIN HFA) 108 (90 Base) MCG/ACT inhaler Inhale 1-2 puffs into the lungs every 4 (four) hours as needed for wheezing or shortness of breath. 18 g 0  ? cetirizine (ZYRTEC) 10 MG tablet Take 1 tablet (10 mg total) by mouth daily. 90 tablet 3  ? EPINEPHrine (EPIPEN 2-PAK) 0.3 mg/0.3 mL IJ SOAJ injection Inject 0.3 mg into the muscle as needed for anaphylaxis (FOR ALLERGY INJECTIONS.). 2 each 2  ? mometasone (NASONEX) 50 MCG/ACT nasal spray Place 2 sprays into the nose daily. 1 each 12  ? montelukast (SINGULAIR) 10 MG tablet TAKE 1 TABLET(10 MG) BY MOUTH DAILY 90 tablet 1  ? QVAR REDIHALER 80 MCG/ACT inhaler USE 2 INHALATIONS TWICE A DAY TO PREVENT COUGH OR WHEEZE, RINSE MOUTH AFTER USE 31.8 g 0  ? triamcinolone ointment (KENALOG) 0.1 % Apply topically 2 (two) times daily. 453 g 1  ? ipratropium (ATROVENT) 0.06 % nasal spray USE 2 SPRAYS IN EACH NOSTRIL THREE TIMES DAILY (Patient not taking: Reported on 12/11/2021) 45 mL 1  ? ?No current facility-administered medications for this visit.  ? ?Allergies: ?Allergies  ?Allergen Reactions  ? Povidone Iodine Other (See Comments)  ? ?I reviewed his past medical history, social history, family history, and environmental history and no significant changes have been reported from his previous visit. ? ?ROS: All others negative except as noted per HPI.  ? ?Objective: ?BP 118/68    Pulse 81   Temp 98.1 ?F (36.7 ?C) (Temporal)   Resp 18   Ht 5' 11.5" (1.816 m)   Wt 221 lb 14.4 oz (100.7 kg)   SpO2 97%   BMI 30.52 kg/m?  ?Body mass index is 30.52 kg/m?. ?General Appearance:  Alert, cooperative, no distress, appears stated age  ?Head:  Normocephalic, without obvious abnormality, atraumatic  ?Eyes:  Conjunctiva clear, EOM's intact  ?Nose: Nares normal,  erythematous nasal mucosa, hypertrophic turbinates, no visible anterior polyps, and septum midline  ?Throat: Lips, tongue normal; teeth and gums normal, normal posterior oropharynx and no tonsillar exudate  ?Neck: Supple, symmetrical  ?Lungs:   clear to auscultation bilaterally, Respirations unlabored, no coughing  ?Heart:  regular rate and rhythm and no murmur, Appears well perfused  ?Extremities: No edema  ?Skin: Skin color, texture, turgor normal, no rashes or lesions on visualized portions of skin  ?Neurologic: No gross deficits  ? ?Previous notes and tests were reviewed. ?The plan was reviewed with the patient/family, and all questions/concerned were addressed. ? ?It was my pleasure to see Jeffrey Duran today and participate in his care. Please feel free to contact me with  any questions or concerns. ? ?Sincerely, ? ?Ferol Luz, MD  ?Allergy & Immunology ? ?[facility] ?

## 2021-12-12 ENCOUNTER — Telehealth: Payer: Self-pay

## 2021-12-12 NOTE — Telephone Encounter (Signed)
A PA came in for mometasone nasal spray. I let patient know that he can purchase it over the counter.  Patient didn't realize he could purchase it over the counter. Told patient if he has any problems getting his nasonex to give [place] a call. ?

## 2022-01-22 ENCOUNTER — Other Ambulatory Visit: Payer: Self-pay

## 2022-01-22 MED ORDER — ALBUTEROL SULFATE HFA 108 (90 BASE) MCG/ACT IN AERS: 1.0000 | INHALATION_SPRAY | RESPIRATORY_TRACT | 2 refills | Status: DC | PRN

## 2022-01-22 MED ORDER — CETIRIZINE HCL 10 MG PO TABS: 10.0000 mg | ORAL_TABLET | Freq: Every day | ORAL | 2 refills | Status: DC

## 2022-01-22 MED ORDER — MONTELUKAST SODIUM 10 MG PO TABS: ORAL_TABLET | ORAL | 1 refills | Status: DC

## 2022-01-22 MED ORDER — QVAR REDIHALER 80 MCG/ACT IN AERB: INHALATION_SPRAY | RESPIRATORY_TRACT | 0 refills | Status: DC

## 2022-01-22 MED ORDER — TRIAMCINOLONE ACETONIDE 0.1 % EX OINT: TOPICAL_OINTMENT | Freq: Two times a day (BID) | CUTANEOUS | 1 refills | Status: DC

## 2022-02-04 DIAGNOSIS — J3081 Allergic rhinitis due to animal (cat) (dog) hair and dander: Secondary | ICD-10-CM | POA: Diagnosis not present

## 2022-02-04 NOTE — Progress Notes (Signed)
VIALS EXP 02-05-23 

## 2022-02-14 ENCOUNTER — Ambulatory Visit (INDEPENDENT_AMBULATORY_CARE_PROVIDER_SITE_OTHER): Payer: BC Managed Care – PPO

## 2022-02-14 DIAGNOSIS — J309 Allergic rhinitis, unspecified: Secondary | ICD-10-CM | POA: Diagnosis not present

## 2022-02-14 MED ORDER — EPINEPHRINE 0.3 MG/0.3ML IJ SOAJ: 0.3000 mg | Freq: Once | INTRAMUSCULAR | 1 refills | Status: AC

## 2022-02-14 NOTE — Progress Notes (Signed)
Immunotherapy   Patient Details  Name: Jeffrey Duran MRN: [id number] Date of Birth: March 12, 2001  02/14/2022  Jeffrey Duran started injections for Blue 1:100,000 (T-G-D-H) Following schedule: A  Frequency:1 time per week Epi-Pen:Epi-Pen Available  Consent signed and patient instructions given.   Jeffrey Duran 02/14/2022, 8:59 AM

## 2022-02-18 ENCOUNTER — Other Ambulatory Visit: Payer: Self-pay

## 2022-02-18 MED ORDER — EPINEPHRINE 0.3 MG/0.3ML IJ SOAJ: 0.3000 mg | INTRAMUSCULAR | 2 refills | Status: DC | PRN

## 2022-02-22 ENCOUNTER — Ambulatory Visit (INDEPENDENT_AMBULATORY_CARE_PROVIDER_SITE_OTHER): Payer: BC Managed Care – PPO | Admitting: *Deleted

## 2022-02-22 DIAGNOSIS — J309 Allergic rhinitis, unspecified: Secondary | ICD-10-CM | POA: Diagnosis not present

## 2022-03-08 ENCOUNTER — Ambulatory Visit (INDEPENDENT_AMBULATORY_CARE_PROVIDER_SITE_OTHER): Payer: BC Managed Care – PPO

## 2022-03-08 DIAGNOSIS — J309 Allergic rhinitis, unspecified: Secondary | ICD-10-CM | POA: Diagnosis not present

## 2022-03-13 ENCOUNTER — Ambulatory Visit (INDEPENDENT_AMBULATORY_CARE_PROVIDER_SITE_OTHER): Payer: BC Managed Care – PPO

## 2022-03-13 DIAGNOSIS — J309 Allergic rhinitis, unspecified: Secondary | ICD-10-CM | POA: Diagnosis not present

## 2022-03-22 ENCOUNTER — Ambulatory Visit (INDEPENDENT_AMBULATORY_CARE_PROVIDER_SITE_OTHER): Payer: BC Managed Care – PPO | Admitting: *Deleted

## 2022-03-22 DIAGNOSIS — J309 Allergic rhinitis, unspecified: Secondary | ICD-10-CM

## 2022-04-01 ENCOUNTER — Ambulatory Visit (INDEPENDENT_AMBULATORY_CARE_PROVIDER_SITE_OTHER): Payer: BC Managed Care – PPO

## 2022-04-01 DIAGNOSIS — J309 Allergic rhinitis, unspecified: Secondary | ICD-10-CM | POA: Diagnosis not present

## 2022-04-15 ENCOUNTER — Ambulatory Visit (INDEPENDENT_AMBULATORY_CARE_PROVIDER_SITE_OTHER): Payer: BC Managed Care – PPO

## 2022-04-15 DIAGNOSIS — J309 Allergic rhinitis, unspecified: Secondary | ICD-10-CM | POA: Diagnosis not present

## 2022-04-26 ENCOUNTER — Ambulatory Visit (INDEPENDENT_AMBULATORY_CARE_PROVIDER_SITE_OTHER): Payer: BC Managed Care – PPO | Admitting: *Deleted

## 2022-04-26 ENCOUNTER — Other Ambulatory Visit: Payer: Self-pay | Admitting: Internal Medicine

## 2022-04-26 DIAGNOSIS — J309 Allergic rhinitis, unspecified: Secondary | ICD-10-CM | POA: Diagnosis not present

## 2022-04-30 ENCOUNTER — Ambulatory Visit (INDEPENDENT_AMBULATORY_CARE_PROVIDER_SITE_OTHER): Payer: BC Managed Care – PPO

## 2022-04-30 DIAGNOSIS — J309 Allergic rhinitis, unspecified: Secondary | ICD-10-CM | POA: Diagnosis not present

## 2022-05-09 ENCOUNTER — Ambulatory Visit (INDEPENDENT_AMBULATORY_CARE_PROVIDER_SITE_OTHER): Payer: BC Managed Care – PPO

## 2022-05-09 DIAGNOSIS — J309 Allergic rhinitis, unspecified: Secondary | ICD-10-CM

## 2022-05-13 ENCOUNTER — Ambulatory Visit (INDEPENDENT_AMBULATORY_CARE_PROVIDER_SITE_OTHER): Payer: BC Managed Care – PPO

## 2022-05-13 DIAGNOSIS — J309 Allergic rhinitis, unspecified: Secondary | ICD-10-CM

## 2022-05-24 ENCOUNTER — Ambulatory Visit (INDEPENDENT_AMBULATORY_CARE_PROVIDER_SITE_OTHER): Payer: BC Managed Care – PPO

## 2022-05-24 DIAGNOSIS — J309 Allergic rhinitis, unspecified: Secondary | ICD-10-CM | POA: Diagnosis not present

## 2022-05-31 ENCOUNTER — Ambulatory Visit (INDEPENDENT_AMBULATORY_CARE_PROVIDER_SITE_OTHER): Payer: BC Managed Care – PPO

## 2022-05-31 DIAGNOSIS — J309 Allergic rhinitis, unspecified: Secondary | ICD-10-CM | POA: Diagnosis not present

## 2022-06-04 ENCOUNTER — Ambulatory Visit (INDEPENDENT_AMBULATORY_CARE_PROVIDER_SITE_OTHER): Payer: BC Managed Care – PPO

## 2022-06-04 DIAGNOSIS — J309 Allergic rhinitis, unspecified: Secondary | ICD-10-CM

## 2022-06-10 ENCOUNTER — Ambulatory Visit (INDEPENDENT_AMBULATORY_CARE_PROVIDER_SITE_OTHER): Payer: BC Managed Care – PPO

## 2022-06-10 DIAGNOSIS — J309 Allergic rhinitis, unspecified: Secondary | ICD-10-CM | POA: Diagnosis not present

## 2022-06-18 ENCOUNTER — Ambulatory Visit (INDEPENDENT_AMBULATORY_CARE_PROVIDER_SITE_OTHER): Payer: BC Managed Care – PPO

## 2022-06-18 DIAGNOSIS — J309 Allergic rhinitis, unspecified: Secondary | ICD-10-CM

## 2022-06-28 ENCOUNTER — Ambulatory Visit (INDEPENDENT_AMBULATORY_CARE_PROVIDER_SITE_OTHER): Payer: BC Managed Care – PPO

## 2022-06-28 DIAGNOSIS — J309 Allergic rhinitis, unspecified: Secondary | ICD-10-CM

## 2022-07-18 ENCOUNTER — Ambulatory Visit (INDEPENDENT_AMBULATORY_CARE_PROVIDER_SITE_OTHER): Payer: BC Managed Care – PPO

## 2022-07-18 DIAGNOSIS — J309 Allergic rhinitis, unspecified: Secondary | ICD-10-CM

## 2022-07-24 ENCOUNTER — Ambulatory Visit (INDEPENDENT_AMBULATORY_CARE_PROVIDER_SITE_OTHER): Payer: BC Managed Care – PPO | Admitting: *Deleted

## 2022-07-24 DIAGNOSIS — J309 Allergic rhinitis, unspecified: Secondary | ICD-10-CM

## 2022-07-29 ENCOUNTER — Ambulatory Visit (INDEPENDENT_AMBULATORY_CARE_PROVIDER_SITE_OTHER): Payer: BC Managed Care – PPO

## 2022-07-29 DIAGNOSIS — J309 Allergic rhinitis, unspecified: Secondary | ICD-10-CM

## 2022-08-09 ENCOUNTER — Ambulatory Visit (INDEPENDENT_AMBULATORY_CARE_PROVIDER_SITE_OTHER): Payer: BC Managed Care – PPO

## 2022-08-09 DIAGNOSIS — J309 Allergic rhinitis, unspecified: Secondary | ICD-10-CM | POA: Diagnosis not present

## 2022-08-19 ENCOUNTER — Ambulatory Visit (INDEPENDENT_AMBULATORY_CARE_PROVIDER_SITE_OTHER): Payer: BC Managed Care – PPO | Admitting: *Deleted

## 2022-08-19 DIAGNOSIS — J309 Allergic rhinitis, unspecified: Secondary | ICD-10-CM | POA: Diagnosis not present

## 2022-08-29 ENCOUNTER — Ambulatory Visit (INDEPENDENT_AMBULATORY_CARE_PROVIDER_SITE_OTHER): Payer: BC Managed Care – PPO

## 2022-08-29 DIAGNOSIS — J309 Allergic rhinitis, unspecified: Secondary | ICD-10-CM

## 2022-09-04 ENCOUNTER — Ambulatory Visit (INDEPENDENT_AMBULATORY_CARE_PROVIDER_SITE_OTHER): Payer: BC Managed Care – PPO

## 2022-09-04 DIAGNOSIS — J309 Allergic rhinitis, unspecified: Secondary | ICD-10-CM | POA: Diagnosis not present

## 2022-09-12 ENCOUNTER — Ambulatory Visit (INDEPENDENT_AMBULATORY_CARE_PROVIDER_SITE_OTHER): Payer: BC Managed Care – PPO

## 2022-09-12 DIAGNOSIS — J309 Allergic rhinitis, unspecified: Secondary | ICD-10-CM

## 2022-09-17 ENCOUNTER — Ambulatory Visit (INDEPENDENT_AMBULATORY_CARE_PROVIDER_SITE_OTHER): Payer: BC Managed Care – PPO

## 2022-09-17 DIAGNOSIS — J309 Allergic rhinitis, unspecified: Secondary | ICD-10-CM

## 2022-09-23 ENCOUNTER — Ambulatory Visit (INDEPENDENT_AMBULATORY_CARE_PROVIDER_SITE_OTHER): Payer: BC Managed Care – PPO

## 2022-09-23 DIAGNOSIS — J309 Allergic rhinitis, unspecified: Secondary | ICD-10-CM | POA: Diagnosis not present

## 2022-10-03 ENCOUNTER — Ambulatory Visit (INDEPENDENT_AMBULATORY_CARE_PROVIDER_SITE_OTHER): Payer: BC Managed Care – PPO

## 2022-10-03 DIAGNOSIS — J309 Allergic rhinitis, unspecified: Secondary | ICD-10-CM

## 2022-10-08 ENCOUNTER — Ambulatory Visit (INDEPENDENT_AMBULATORY_CARE_PROVIDER_SITE_OTHER): Payer: BC Managed Care – PPO

## 2022-10-08 DIAGNOSIS — J309 Allergic rhinitis, unspecified: Secondary | ICD-10-CM | POA: Diagnosis not present

## 2022-10-18 ENCOUNTER — Ambulatory Visit (INDEPENDENT_AMBULATORY_CARE_PROVIDER_SITE_OTHER): Payer: BC Managed Care – PPO

## 2022-10-18 DIAGNOSIS — J309 Allergic rhinitis, unspecified: Secondary | ICD-10-CM

## 2022-10-24 ENCOUNTER — Ambulatory Visit (INDEPENDENT_AMBULATORY_CARE_PROVIDER_SITE_OTHER): Payer: BC Managed Care – PPO

## 2022-10-24 DIAGNOSIS — J309 Allergic rhinitis, unspecified: Secondary | ICD-10-CM | POA: Diagnosis not present

## 2022-10-28 ENCOUNTER — Ambulatory Visit (INDEPENDENT_AMBULATORY_CARE_PROVIDER_SITE_OTHER): Payer: BC Managed Care – PPO

## 2022-10-28 DIAGNOSIS — J309 Allergic rhinitis, unspecified: Secondary | ICD-10-CM | POA: Diagnosis not present

## 2022-11-04 ENCOUNTER — Ambulatory Visit (INDEPENDENT_AMBULATORY_CARE_PROVIDER_SITE_OTHER): Payer: BC Managed Care – PPO

## 2022-11-04 DIAGNOSIS — J309 Allergic rhinitis, unspecified: Secondary | ICD-10-CM

## 2022-11-15 ENCOUNTER — Ambulatory Visit (INDEPENDENT_AMBULATORY_CARE_PROVIDER_SITE_OTHER): Payer: BC Managed Care – PPO

## 2022-11-15 DIAGNOSIS — J309 Allergic rhinitis, unspecified: Secondary | ICD-10-CM | POA: Diagnosis not present

## 2022-11-19 ENCOUNTER — Ambulatory Visit (INDEPENDENT_AMBULATORY_CARE_PROVIDER_SITE_OTHER): Payer: BC Managed Care – PPO

## 2022-11-19 DIAGNOSIS — J309 Allergic rhinitis, unspecified: Secondary | ICD-10-CM

## 2022-11-27 ENCOUNTER — Ambulatory Visit (INDEPENDENT_AMBULATORY_CARE_PROVIDER_SITE_OTHER): Payer: BC Managed Care – PPO

## 2022-11-27 DIAGNOSIS — J309 Allergic rhinitis, unspecified: Secondary | ICD-10-CM

## 2022-12-05 ENCOUNTER — Ambulatory Visit (INDEPENDENT_AMBULATORY_CARE_PROVIDER_SITE_OTHER): Payer: BC Managed Care – PPO

## 2022-12-05 DIAGNOSIS — J309 Allergic rhinitis, unspecified: Secondary | ICD-10-CM

## 2022-12-10 ENCOUNTER — Ambulatory Visit (INDEPENDENT_AMBULATORY_CARE_PROVIDER_SITE_OTHER): Payer: BC Managed Care – PPO

## 2022-12-10 DIAGNOSIS — J309 Allergic rhinitis, unspecified: Secondary | ICD-10-CM | POA: Diagnosis not present

## 2022-12-16 ENCOUNTER — Ambulatory Visit (INDEPENDENT_AMBULATORY_CARE_PROVIDER_SITE_OTHER): Payer: BC Managed Care – PPO

## 2022-12-16 DIAGNOSIS — J309 Allergic rhinitis, unspecified: Secondary | ICD-10-CM

## 2022-12-23 ENCOUNTER — Ambulatory Visit (INDEPENDENT_AMBULATORY_CARE_PROVIDER_SITE_OTHER): Payer: BC Managed Care – PPO

## 2022-12-23 DIAGNOSIS — J309 Allergic rhinitis, unspecified: Secondary | ICD-10-CM | POA: Diagnosis not present

## 2022-12-31 ENCOUNTER — Ambulatory Visit (INDEPENDENT_AMBULATORY_CARE_PROVIDER_SITE_OTHER): Payer: BC Managed Care – PPO

## 2022-12-31 DIAGNOSIS — J309 Allergic rhinitis, unspecified: Secondary | ICD-10-CM | POA: Diagnosis not present

## 2023-01-06 ENCOUNTER — Ambulatory Visit (INDEPENDENT_AMBULATORY_CARE_PROVIDER_SITE_OTHER): Payer: BC Managed Care – PPO

## 2023-01-06 DIAGNOSIS — J309 Allergic rhinitis, unspecified: Secondary | ICD-10-CM

## 2023-01-14 ENCOUNTER — Ambulatory Visit (INDEPENDENT_AMBULATORY_CARE_PROVIDER_SITE_OTHER): Payer: BC Managed Care – PPO

## 2023-01-14 DIAGNOSIS — J309 Allergic rhinitis, unspecified: Secondary | ICD-10-CM

## 2023-01-24 ENCOUNTER — Ambulatory Visit (INDEPENDENT_AMBULATORY_CARE_PROVIDER_SITE_OTHER): Payer: BC Managed Care – PPO

## 2023-01-24 DIAGNOSIS — J309 Allergic rhinitis, unspecified: Secondary | ICD-10-CM | POA: Diagnosis not present

## 2023-01-30 DIAGNOSIS — J3081 Allergic rhinitis due to animal (cat) (dog) hair and dander: Secondary | ICD-10-CM | POA: Diagnosis not present

## 2023-01-30 NOTE — Progress Notes (Signed)
VIAL EXP 01-30-24 

## 2023-02-04 ENCOUNTER — Ambulatory Visit (INDEPENDENT_AMBULATORY_CARE_PROVIDER_SITE_OTHER): Payer: BC Managed Care – PPO

## 2023-02-04 DIAGNOSIS — J309 Allergic rhinitis, unspecified: Secondary | ICD-10-CM | POA: Diagnosis not present

## 2023-02-14 ENCOUNTER — Ambulatory Visit (INDEPENDENT_AMBULATORY_CARE_PROVIDER_SITE_OTHER): Payer: BC Managed Care – PPO

## 2023-02-14 ENCOUNTER — Telehealth: Payer: Self-pay | Admitting: Internal Medicine

## 2023-02-14 DIAGNOSIS — J309 Allergic rhinitis, unspecified: Secondary | ICD-10-CM

## 2023-02-14 NOTE — Telephone Encounter (Signed)
Spoke with pt and offered to schedule follow up for refill but he denied. I informed him that for any refills he would need to be seen first.

## 2023-02-14 NOTE — Telephone Encounter (Signed)
It has been over a year since we have seen him in clinic legally have to see him before any refills are sent in also need to verify pharmacy

## 2023-02-14 NOTE — Telephone Encounter (Signed)
Patient asking for a refill on medication montelukast (SINGULAIR) 10 MG tablet [[id number]] please advise

## 2023-02-20 ENCOUNTER — Ambulatory Visit (INDEPENDENT_AMBULATORY_CARE_PROVIDER_SITE_OTHER): Payer: BC Managed Care – PPO

## 2023-02-20 DIAGNOSIS — J309 Allergic rhinitis, unspecified: Secondary | ICD-10-CM | POA: Diagnosis not present

## 2023-02-25 ENCOUNTER — Ambulatory Visit: Payer: BC Managed Care – PPO | Admitting: Internal Medicine

## 2023-02-26 ENCOUNTER — Ambulatory Visit (INDEPENDENT_AMBULATORY_CARE_PROVIDER_SITE_OTHER): Payer: BC Managed Care – PPO

## 2023-02-26 DIAGNOSIS — J309 Allergic rhinitis, unspecified: Secondary | ICD-10-CM

## 2023-03-04 ENCOUNTER — Ambulatory Visit: Payer: Self-pay

## 2023-03-04 ENCOUNTER — Ambulatory Visit (INDEPENDENT_AMBULATORY_CARE_PROVIDER_SITE_OTHER): Payer: BC Managed Care – PPO | Admitting: Internal Medicine

## 2023-03-04 ENCOUNTER — Encounter: Payer: Self-pay | Admitting: Internal Medicine

## 2023-03-04 VITALS — BP 118/80 | HR 80 | Temp 98.2°F | Resp 17 | Wt 219.1 lb

## 2023-03-04 DIAGNOSIS — J3089 Other allergic rhinitis: Secondary | ICD-10-CM | POA: Diagnosis not present

## 2023-03-04 DIAGNOSIS — L2089 Other atopic dermatitis: Secondary | ICD-10-CM | POA: Diagnosis not present

## 2023-03-04 DIAGNOSIS — H6992 Unspecified Eustachian tube disorder, left ear: Secondary | ICD-10-CM

## 2023-03-04 DIAGNOSIS — J4530 Mild persistent asthma, uncomplicated: Secondary | ICD-10-CM

## 2023-03-04 DIAGNOSIS — J309 Allergic rhinitis, unspecified: Secondary | ICD-10-CM

## 2023-03-04 MED ORDER — AZELASTINE-FLUTICASONE 137-50 MCG/ACT NA SUSP: 1.0000 | Freq: Two times a day (BID) | NASAL | 5 refills | Status: AC

## 2023-03-04 NOTE — Progress Notes (Signed)
Follow Up Note  RE: Jeffrey Duran MRN: [id number] DOB: 2001-08-02 Date of Office Visit: 03/04/2023  Referring provider: [facility] Primary care provider: [facility]  Chief Complaint: Follow-up, Asthma, Allergic Rhinitis , and Medication Refill  History of Present Illness: I had the pleasure of seeing Jeffrey Duran for a follow up visit at the [facility] on 03/10/2023. He is a 22 y.o. male, who is being followed for asthma, allergic rhinitis, eczema, eustachian tube dysfunction. His previous allergy office visit was on 12/11/21 with Dr. [name]. Today is a regular follow up visit.   ASTHMA - Medical therapy: Qvar 80 mcg 1 puff once a day, Singulair 10 mg daily - Rescue inhaler use: very rare use  - Symptoms: denies cough, wheeze, dyspnea;  feels like asthma is well controlled  - Exacerbation history: 0 ABX for respiratory illness since last visit, 0 OCS, 0ED, 1 UC visits in the past year  - ACT: 24 /25 - Adverse effects of medication: denies  - Previous FEV1: 4.16L, 87% predicted  Allergic rhinitis: current therapy: Zyrtec 10 mg daily, Singulair 10 mg daily,  symptoms improved;  symptoms include:  ear fullness, nasal comngestion  Previous allergy testing: yes grasses, weeds, trees, indoor molds, outdoor molds, dust mites, cat and dog Interested in Allergy Immunotherapy: yes on AIT  Denies any LR/SR with AIT   He has been evaluated by ENT for his persistent left eustachian tube dysfunction.  He was treated for otitis externa and switched from nasal Atrovent to Nasonex.  He has since run out of Nasonex.  Symptoms have returned.  Has not seen ENT since.  Atopic dermatitis: flares mostly arms, neck  -current regimen triamcinolone 0.1% ointment for flares which resolves flares within a couple nights    Assessment and Plan: [name] is a 22 y.o. male with: Other allergic rhinitis - Plan: Spirometry with Graph  Mild persistent asthma,  uncomplicated  Flexural atopic dermatitis  Dysfunction of left eustachian tube Plan: Patient Instructions  Mild Persistent  Asthma: well controlled  PLAN: . - Daily controller medication(s): Qvar 1 puff  daily  - Prior to physical activity: albuterol 2 puffs 10-15 minutes before physical activity. - Rescue medications: albuterol 4 puffs every 4-6 hours as needed - Get Influenza Vaccine and appropriate Pneumonia and COVID 19 boosters  - Asthma control goals:  * Full participation in all desired activities (may need albuterol before activity) * Albuterol use two time or less a week on average (not counting use with activity) * Cough interfering with sleep two time or less a month * Oral steroids no more than once a year * No hospitalizations     Seasonal and perennial allergic rhinitis: improved  - Continue with: Zyrtec (cetirizine) 10mg  tablet once daily and Singulair (montelukast) 10mg  daily - Start Dymista 1 spray in each nostril twice  - You can use an extra dose of the antihistamine, if needed, for breakthrough symptoms.  - Consider nasal saline rinses 1-2 times daily to remove allergens from the nasal cavities as well as help with mucous clearance (this is especially helpful to do before the nasal sprays are given  - Continue allergy injections per protocol     Flexural atopic dermatitis : well controlled  - Continue Triamcinolone 0.1% ointment as needed for flares  - Continue daily moistrurizers twice daily as needed   Follow up: 6 months   Thank you so much for letting me partake in your care today.  Don't  hesitate to reach out if you have any additional concerns!  Ferol Luz, MD  [facility] [place] [place]  No follow-ups on file.  Meds ordered this encounter  Medications   Azelastine-Fluticasone 137-50 MCG/ACT SUSP    Sig: Place 1 spray into the nose in the morning and at bedtime.    Dispense:  23 g    Refill:  5   albuterol (VENTOLIN  HFA) 108 (90 Base) MCG/ACT inhaler    Sig: Inhale 1-2 puffs into the lungs every 4 (four) hours as needed for wheezing or shortness of breath.    Dispense:  18 g    Refill:  2   cetirizine (ZYRTEC) 10 MG tablet    Sig: Take 1 tablet (10 mg total) by mouth daily.    Dispense:  90 tablet    Refill:  2   montelukast (SINGULAIR) 10 MG tablet    Sig: TAKE 1 TABLET(10 MG) BY MOUTH DAILY    Dispense:  90 tablet    Refill:  1   QVAR REDIHALER 80 MCG/ACT inhaler    Sig: USE 2 INHALATIONS TWICE A DAY TO PREVENT COUGH OR WHEEZE (RINSE MOUTH AFTER USE)    Dispense:  31.8 g    Refill:  3   triamcinolone ointment (KENALOG) 0.1 %    Sig: Apply topically 2 (two) times daily.    Dispense:  453 g    Refill:  1   EPINEPHrine 0.3 mg/0.3 mL IJ SOAJ injection    Sig: Inject 0.3 mg into the muscle as needed for anaphylaxis.    Dispense:  1 each    Refill:  1    Lab Orders  No laboratory test(s) ordered today   Diagnostics: Spirometry:  Tracings reviewed. His effort: Good reproducible effort  FVC: 4.10L FEV1: 3.87L, 72% predicted FEV1/FVC ratio: 85% Interpretation: Spirometry consistent with normal pattern.  Please see scanned spirometry results for details.  Medication List:  Current Outpatient Medications  Medication Sig Dispense Refill   acetic acid-hydrocortisone (VOSOL-HC) OTIC solution SMARTSIG:Left Ear     albuterol (PROVENTIL) (2.5 MG/3ML) 0.083% nebulizer solution 2.5 mg.     Azelastine-Fluticasone 137-50 MCG/ACT SUSP Place 1 spray into the nose in the morning and at bedtime. 23 g 5   EPINEPHrine 0.3 mg/0.3 mL IJ SOAJ injection Inject 0.3 mg into the muscle as needed for anaphylaxis. 1 each 1   albuterol (VENTOLIN HFA) 108 (90 Base) MCG/ACT inhaler Inhale 1-2 puffs into the lungs every 4 (four) hours as needed for wheezing or shortness of breath. 18 g 2   cetirizine (ZYRTEC) 10 MG tablet Take 1 tablet (10 mg total) by mouth daily. 90 tablet 2   montelukast (SINGULAIR) 10 MG tablet  TAKE 1 TABLET(10 MG) BY MOUTH DAILY 90 tablet 1   QVAR REDIHALER 80 MCG/ACT inhaler USE 2 INHALATIONS TWICE A DAY TO PREVENT COUGH OR WHEEZE (RINSE MOUTH AFTER USE) 31.8 g 3   triamcinolone ointment (KENALOG) 0.1 % Apply topically 2 (two) times daily. 453 g 1   No current facility-administered medications for this visit.   Allergies: Allergies  Allergen Reactions   Povidone Iodine Other (See Comments)   I reviewed his past medical history, social history, family history, and environmental history and no significant changes have been reported from his previous visit.  ROS: All others negative except as noted per HPI.   Objective: BP 118/80   Pulse 80   Temp 98.2 F (36.8 C) (Temporal)   Resp 17  Wt 219 lb 1.6 oz (99.4 kg)   SpO2 98%   BMI 30.13 kg/m  Body mass index is 30.13 kg/m. General Appearance:  Alert, cooperative, no distress, appears stated age  Head:  Normocephalic, without obvious abnormality, atraumatic  Eyes:  Conjunctiva clear, EOM's intact  Nose: Nares normal,  erythematous nasal mucosa, hypertrophic turbinates, no visible anterior polyps, and septum midline  Throat: Lips, tongue normal; teeth and gums normal, normal posterior oropharynx  Neck: Supple, symmetrical  Lungs:   clear to auscultation bilaterally, Respirations unlabored, no coughing  Heart:  regular rate and rhythm and no murmur, Appears well perfused  Extremities: No edema  Skin: Skin color, texture, turgor normal, no rashes or lesions on visualized portions of skin  Neurologic: No gross deficits   Previous notes and tests were reviewed. The plan was reviewed with the patient/family, and all questions/concerned were addressed.  It was my pleasure to see Jeffrey Duran today and participate in his care. Please feel free to contact me with any questions or concerns.  Sincerely,  Ferol Luz, MD  Allergy & Immunology  [facility]

## 2023-03-04 NOTE — Patient Instructions (Addendum)
Mild Persistent  Asthma: well controlled  PLAN: . - Daily controller medication(s): Qvar 1 puff  daily  - Prior to physical activity: albuterol 2 puffs 10-15 minutes before physical activity. - Rescue medications: albuterol 4 puffs every 4-6 hours as needed - Get Influenza Vaccine and appropriate Pneumonia and COVID 19 boosters  - Asthma control goals:  * Full participation in all desired activities (may need albuterol before activity) * Albuterol use two time or less a week on average (not counting use with activity) * Cough interfering with sleep two time or less a month * Oral steroids no more than once a year * No hospitalizations     Seasonal and perennial allergic rhinitis: improved  - Continue with: Zyrtec (cetirizine) 10mg  tablet once daily and Singulair (montelukast) 10mg  daily - Start Dymista 1 spray in each nostril twice  - You can use an extra dose of the antihistamine, if needed, for breakthrough symptoms.  - Consider nasal saline rinses 1-2 times daily to remove allergens from the nasal cavities as well as help with mucous clearance (this is especially helpful to do before the nasal sprays are given  - Continue allergy injections per protocol     Flexural atopic dermatitis : well controlled  - Continue Triamcinolone 0.1% ointment as needed for flares  - Continue daily moistrurizers twice daily as needed   Follow up: 6 months   Thank you so much for letting me partake in your care today.  Don't hesitate to reach out if you have any additional concerns!  Ferol Luz, MD  [facility] [place] [place]

## 2023-03-05 ENCOUNTER — Other Ambulatory Visit (HOSPITAL_COMMUNITY): Payer: Self-pay

## 2023-03-05 ENCOUNTER — Telehealth: Payer: Self-pay

## 2023-03-05 MED ORDER — TRIAMCINOLONE ACETONIDE 0.1 % EX OINT: TOPICAL_OINTMENT | Freq: Two times a day (BID) | CUTANEOUS | 1 refills | Status: AC

## 2023-03-05 MED ORDER — ALBUTEROL SULFATE HFA 108 (90 BASE) MCG/ACT IN AERS: 1.0000 | INHALATION_SPRAY | RESPIRATORY_TRACT | 2 refills | Status: AC | PRN

## 2023-03-05 MED ORDER — MONTELUKAST SODIUM 10 MG PO TABS: ORAL_TABLET | ORAL | 1 refills | Status: DC

## 2023-03-05 MED ORDER — EPINEPHRINE 0.3 MG/0.3ML IJ SOAJ: 0.3000 mg | INTRAMUSCULAR | 1 refills | Status: AC | PRN

## 2023-03-05 MED ORDER — CETIRIZINE HCL 10 MG PO TABS: 10.0000 mg | ORAL_TABLET | Freq: Every day | ORAL | 2 refills | Status: AC

## 2023-03-05 MED ORDER — QVAR REDIHALER 80 MCG/ACT IN AERB
INHALATION_SPRAY | RESPIRATORY_TRACT | 3 refills | Status: AC
Start: 2023-03-05 — End: ?

## 2023-03-05 NOTE — Telephone Encounter (Signed)
Patient Advocate Encounter  Prior authorization for Dymista 137-50MCG/ACT suspension submitted and APPROVED through [employer].  Test billing returns $5.00 copay for 30 day supply. This is processed with generic.  Test billing returns $101.32 copay for 30 day supply. This is processed with brand.  Key [id number] Effective: 03-05-2023 - 03-03-2024

## 2023-03-05 NOTE — Telephone Encounter (Signed)
Dymista has been approved through [employer] should receive meds shortly.

## 2023-03-12 ENCOUNTER — Ambulatory Visit (INDEPENDENT_AMBULATORY_CARE_PROVIDER_SITE_OTHER): Payer: BC Managed Care – PPO

## 2023-03-12 DIAGNOSIS — J309 Allergic rhinitis, unspecified: Secondary | ICD-10-CM | POA: Diagnosis not present

## 2023-03-18 ENCOUNTER — Ambulatory Visit (INDEPENDENT_AMBULATORY_CARE_PROVIDER_SITE_OTHER): Payer: BC Managed Care – PPO

## 2023-03-18 DIAGNOSIS — J309 Allergic rhinitis, unspecified: Secondary | ICD-10-CM | POA: Diagnosis not present

## 2023-03-24 ENCOUNTER — Ambulatory Visit (INDEPENDENT_AMBULATORY_CARE_PROVIDER_SITE_OTHER): Payer: BC Managed Care – PPO

## 2023-03-24 DIAGNOSIS — J309 Allergic rhinitis, unspecified: Secondary | ICD-10-CM

## 2023-04-01 ENCOUNTER — Ambulatory Visit (INDEPENDENT_AMBULATORY_CARE_PROVIDER_SITE_OTHER): Payer: BC Managed Care – PPO

## 2023-04-01 DIAGNOSIS — J309 Allergic rhinitis, unspecified: Secondary | ICD-10-CM

## 2023-04-09 ENCOUNTER — Ambulatory Visit (INDEPENDENT_AMBULATORY_CARE_PROVIDER_SITE_OTHER): Payer: BC Managed Care – PPO

## 2023-04-09 DIAGNOSIS — J309 Allergic rhinitis, unspecified: Secondary | ICD-10-CM

## 2023-04-14 DIAGNOSIS — J3081 Allergic rhinitis due to animal (cat) (dog) hair and dander: Secondary | ICD-10-CM

## 2023-04-14 NOTE — Progress Notes (Signed)
Vials Exp 04/13/24

## 2023-04-22 ENCOUNTER — Ambulatory Visit (INDEPENDENT_AMBULATORY_CARE_PROVIDER_SITE_OTHER): Payer: BC Managed Care – PPO

## 2023-04-22 DIAGNOSIS — J309 Allergic rhinitis, unspecified: Secondary | ICD-10-CM

## 2023-04-28 ENCOUNTER — Ambulatory Visit (INDEPENDENT_AMBULATORY_CARE_PROVIDER_SITE_OTHER): Payer: BC Managed Care – PPO

## 2023-04-28 DIAGNOSIS — J309 Allergic rhinitis, unspecified: Secondary | ICD-10-CM | POA: Diagnosis not present

## 2023-05-07 ENCOUNTER — Encounter: Payer: Self-pay | Admitting: Internal Medicine

## 2023-05-07 ENCOUNTER — Other Ambulatory Visit: Payer: Self-pay

## 2023-05-07 ENCOUNTER — Ambulatory Visit (INDEPENDENT_AMBULATORY_CARE_PROVIDER_SITE_OTHER): Payer: BC Managed Care – PPO | Admitting: Internal Medicine

## 2023-05-07 VITALS — BP 124/68 | HR 78 | Temp 98.3°F | Resp 20 | Wt 227.5 lb

## 2023-05-07 DIAGNOSIS — L2089 Other atopic dermatitis: Secondary | ICD-10-CM | POA: Diagnosis not present

## 2023-05-07 DIAGNOSIS — J4530 Mild persistent asthma, uncomplicated: Secondary | ICD-10-CM

## 2023-05-07 DIAGNOSIS — J302 Other seasonal allergic rhinitis: Secondary | ICD-10-CM | POA: Diagnosis not present

## 2023-05-07 DIAGNOSIS — J018 Other acute sinusitis: Secondary | ICD-10-CM | POA: Diagnosis not present

## 2023-05-07 MED ORDER — METHYLPREDNISOLONE ACETATE 40 MG/ML IJ SUSP
40.0000 mg | Freq: Once | INTRAMUSCULAR | Status: AC
  Administered 2023-05-07: 40 mg via INTRAMUSCULAR

## 2023-05-07 NOTE — Patient Instructions (Signed)
Mild Persistent  Asthma: well controlled PLAN: . - Daily controller medication(s): Qvar 1 puff  daily  - Prior to physical activity: albuterol 2 puffs 10-15 minutes before physical activity. - Rescue medications: albuterol 4 puffs every 4-6 hours as needed - Get Influenza Vaccine and appropriate Pneumonia and COVID 19 boosters  - Asthma control goals:  * Full participation in all desired activities (may need albuterol before activity) * Albuterol use two time or less a week on average (not counting use with activity) * Cough interfering with sleep two time or less a month * Oral steroids no more than once a year * No hospitalizations     Seasonal and perennial allergic rhinitis: with exacerbation - IM methylprednisone 40 mg in clinic - Continue with: Zyrtec (cetirizine) 10mg  tablet once daily and Singulair (montelukast) 10mg  daily - Start Dymista 1 spray in each nostril twice  - You can use an extra dose of the antihistamine, if needed, for breakthrough symptoms.  - Use nasal saline rinses 1-2 times daily to remove allergens from the nasal cavities as well as help with mucous clearance (this is especially helpful to do before the nasal sprays are given  - Continue allergy injections per protocol - skip this week Let us know if you are not feeling better by early next week.  If you are feeling better next week, okay to get your allergy injections.    Flexural atopic dermatitis : well controlled  - Continue Triamcinolone 0.1% ointment as needed for flares  - Continue daily moistrurizers twice daily as needed   Follow up: 6 months, sooner if needed It was a pleasure meeting you in clinic today! Thank you for allowing me to participate in your care.  Tonny Bollman, MD [facility]

## 2023-05-07 NOTE — Progress Notes (Signed)
FOLLOW UP Date of Service/Encounter:  05/07/23  Subjective:  Jeffrey Duran (DOB: 30-May-2001) is a 22 y.o. male who [facility] in re-evaluation of the following: acute visit for drainage and sore throat History obtained from: chart review and patient.  For Review, LV was on 03/04/23  with Dr. [name] seen for routine follow-up for asthma, allergic rhinitis, eczema, eustachian tube dysfunction  See below for summary of history and diagnostics.   Therapeutic plans/changes recommended: He was continued on current meds and dymista was started.  He was continued on AIT. ----------------------------------------------------- Pertinent History/Diagnostics:  Asthma: Current meds: QVAR 80 mcg 1 puff daily, singulair 10 mg daily. Allergic Rhinitis:  ear fullness, nasal comngestion  Previous allergy testing: positive to grasses, weeds, trees, indoor molds, outdoor molds, dust mites, cat and dog On AIT since 02/14/2022.  Reached 0.5 mL of red vial on 01/06/2023. has been evaluated by ENT for his persistent left eustachian tube dysfunction. He was treated for otitis externa and switched from nasal Atrovent to Nasonex  Denies any LR/SR with AIT  Current meds: Zyrtec 10 mg daily, Singulair 10 mg daily, Dymista nasal spray Eczema:  flares mostly arms, neck  -current regimen triamcinolone 0.1%  --------------------------------------------------- Today presents for acute visit for drainage and sore throat. He has significant drainage for the past 5 days. His throat is sore and itchy.  Drainage is clear and thick.  He is taking tylenol severe sinus which doesn't seem to help.  He denies any significant pressure in his face.  No changes to his sense of smell or taste. He is on his daily montelukast, cetirizine and nose spray-he is unsure which.  He has tried some sinus rinses but using drainage returns.  These provide only temporary relief. No fevers.  No  known sick contacts. It has been around 6 months since he needed antibiotics.  Asthma is controlled. He is due for his allergy injection today.  All medications reviewed by clinical staff and updated in chart. No new pertinent medical or surgical history except as noted in HPI.  ROS: All others negative except as noted per HPI.   Objective:  BP 124/68 (BP Location: Left Arm, Patient Position: Sitting, Cuff Size: Normal)   Pulse 78   Temp 98.3 F (36.8 C) (Oral)   Resp 20   Wt 227 lb 8 oz (103.2 kg)   BMI 31.29 kg/m  Body mass index is 31.29 kg/m. Physical Exam: General Appearance:  Alert, cooperative, no distress, appears stated age  Head:  Normocephalic, without obvious abnormality, atraumatic  Eyes:  Conjunctiva clear, EOM's intact  Ears EACs normal bilaterally and normal TMs bilaterally  Nose: Nares normal, normal mucosa and no visible anterior polyps  Throat: Lips, tongue normal; teeth and gums normal, normal posterior oropharynx  Neck: Supple, symmetrical  Lungs:   clear to auscultation bilaterally, Respirations unlabored, no coughing  Heart:  regular rate and rhythm and no murmur, Appears well perfused  Extremities: No edema  Skin: Skin color, texture, turgor normal and no rashes or lesions on visualized portions of skin  Neurologic: No gross deficits   Labs:  Lab Orders  No laboratory test(s) ordered today   Assessment/Plan   Mild Persistent  Asthma: well controlled PLAN: . - Daily controller medication(s): Qvar 1 puff  daily  - Prior to physical activity: albuterol 2 puffs 10-15 minutes before physical activity. - Rescue medications: albuterol 4 puffs every 4-6 hours as needed - Get Influenza  Vaccine and appropriate Pneumonia and COVID 19 boosters  - Asthma control goals:  * Full participation in all desired activities (may need albuterol before activity) * Albuterol use two time or less a week on average (not counting use with activity) * Cough  interfering with sleep two time or less a month * Oral steroids no more than once a year * No hospitalizations     Seasonal and perennial allergic rhinitis: with exacerbation/acute sinusitis-do not suspect bacterial at this time. - IM methylprednisone 40 mg in clinic Advised to stop Tylenol severe sinus.  Utilize sinus rinses more often during current increased symptoms. - Continue with: Zyrtec (cetirizine) 10mg  tablet once daily and Singulair (montelukast) 10mg  daily - Start Dymista 1 spray in each nostril twice  - You can use an extra dose of the antihistamine, if needed, for breakthrough symptoms.  - Use nasal saline rinses 1-2 times daily to remove allergens from the nasal cavities as well as help with mucous clearance (this is especially helpful to do before the nasal sprays are given  - Continue allergy injections per protocol - skip this week Let us know if you are not feeling better by early next week.  If you are feeling better next week, okay to get your allergy injections.   Flexural atopic dermatitis : well controlled  - Continue Triamcinolone 0.1% ointment as needed for flares  - Continue daily moistrurizers twice daily as needed   Follow up: 6 months, sooner if needed It was a pleasure meeting you in clinic today! Thank you for allowing me to participate in your care.  Tonny Bollman, MD [facility]   Other: none

## 2023-05-13 ENCOUNTER — Ambulatory Visit (INDEPENDENT_AMBULATORY_CARE_PROVIDER_SITE_OTHER): Payer: Self-pay

## 2023-05-13 DIAGNOSIS — J309 Allergic rhinitis, unspecified: Secondary | ICD-10-CM | POA: Diagnosis not present

## 2023-05-22 ENCOUNTER — Ambulatory Visit (INDEPENDENT_AMBULATORY_CARE_PROVIDER_SITE_OTHER): Payer: BC Managed Care – PPO

## 2023-05-22 DIAGNOSIS — J309 Allergic rhinitis, unspecified: Secondary | ICD-10-CM

## 2023-05-27 ENCOUNTER — Ambulatory Visit (INDEPENDENT_AMBULATORY_CARE_PROVIDER_SITE_OTHER): Payer: Self-pay

## 2023-05-27 DIAGNOSIS — J309 Allergic rhinitis, unspecified: Secondary | ICD-10-CM

## 2023-06-02 ENCOUNTER — Ambulatory Visit (INDEPENDENT_AMBULATORY_CARE_PROVIDER_SITE_OTHER): Payer: BC Managed Care – PPO

## 2023-06-02 DIAGNOSIS — J309 Allergic rhinitis, unspecified: Secondary | ICD-10-CM | POA: Diagnosis not present

## 2023-06-13 ENCOUNTER — Ambulatory Visit (INDEPENDENT_AMBULATORY_CARE_PROVIDER_SITE_OTHER): Payer: BC Managed Care – PPO

## 2023-06-13 DIAGNOSIS — J309 Allergic rhinitis, unspecified: Secondary | ICD-10-CM | POA: Diagnosis not present

## 2023-06-25 ENCOUNTER — Ambulatory Visit (INDEPENDENT_AMBULATORY_CARE_PROVIDER_SITE_OTHER): Payer: BC Managed Care – PPO

## 2023-06-25 DIAGNOSIS — J309 Allergic rhinitis, unspecified: Secondary | ICD-10-CM

## 2023-07-09 ENCOUNTER — Ambulatory Visit (INDEPENDENT_AMBULATORY_CARE_PROVIDER_SITE_OTHER): Payer: BC Managed Care – PPO

## 2023-07-09 DIAGNOSIS — J309 Allergic rhinitis, unspecified: Secondary | ICD-10-CM | POA: Diagnosis not present

## 2023-07-15 DIAGNOSIS — J3081 Allergic rhinitis due to animal (cat) (dog) hair and dander: Secondary | ICD-10-CM | POA: Diagnosis not present

## 2023-07-15 NOTE — Progress Notes (Signed)
VIAL EXP 07-14-24

## 2023-07-29 ENCOUNTER — Ambulatory Visit (INDEPENDENT_AMBULATORY_CARE_PROVIDER_SITE_OTHER): Payer: BC Managed Care – PPO

## 2023-07-29 DIAGNOSIS — J309 Allergic rhinitis, unspecified: Secondary | ICD-10-CM | POA: Diagnosis not present

## 2023-08-11 ENCOUNTER — Ambulatory Visit (INDEPENDENT_AMBULATORY_CARE_PROVIDER_SITE_OTHER): Payer: BC Managed Care – PPO

## 2023-08-11 DIAGNOSIS — J309 Allergic rhinitis, unspecified: Secondary | ICD-10-CM

## 2023-08-25 ENCOUNTER — Ambulatory Visit (INDEPENDENT_AMBULATORY_CARE_PROVIDER_SITE_OTHER): Payer: Self-pay

## 2023-08-25 DIAGNOSIS — J309 Allergic rhinitis, unspecified: Secondary | ICD-10-CM | POA: Diagnosis not present

## 2023-09-02 ENCOUNTER — Encounter: Payer: Self-pay | Admitting: Internal Medicine

## 2023-09-02 ENCOUNTER — Ambulatory Visit: Payer: Self-pay

## 2023-09-02 ENCOUNTER — Ambulatory Visit (INDEPENDENT_AMBULATORY_CARE_PROVIDER_SITE_OTHER): Payer: BC Managed Care – PPO | Admitting: Internal Medicine

## 2023-09-02 VITALS — BP 110/70 | HR 84 | Temp 97.0°F | Resp 18

## 2023-09-02 DIAGNOSIS — L2089 Other atopic dermatitis: Secondary | ICD-10-CM

## 2023-09-02 DIAGNOSIS — J3089 Other allergic rhinitis: Secondary | ICD-10-CM | POA: Diagnosis not present

## 2023-09-02 DIAGNOSIS — J4530 Mild persistent asthma, uncomplicated: Secondary | ICD-10-CM | POA: Diagnosis not present

## 2023-09-02 DIAGNOSIS — J309 Allergic rhinitis, unspecified: Secondary | ICD-10-CM

## 2023-09-02 NOTE — Patient Instructions (Addendum)
 Mild Persistent  Asthma: well controlled Will update spiro at next visit Once Qvar  and albuterol  runs out, plan to switch to Airsupra PLAN: . - Daily controller medication(s): Qvar  1 puff  daily as block therapy  - Prior to physical activity: albuterol  2 puffs 10-15 minutes before physical activity. - Rescue medications: albuterol  4 puffs every 4-6 hours as needed - Get Influenza Vaccine and appropriate Pneumonia and COVID 19 boosters  - Asthma control goals:  * Full participation in all desired activities (may need albuterol  before activity) * Albuterol  use two time or less a week on average (not counting use with activity) * Cough interfering with sleep two time or less a month * Oral steroids no more than once a year * No hospitalizations     Seasonal and perennial allergic rhinitis: well controlled  - Continue with: Zyrtec  (cetirizine ) 10mg  tablet once daily and Singulair  (montelukast ) 10mg  daily - You can use an extra dose of the antihistamine, if needed, for breakthrough symptoms.  - Use nasal saline rinses 1-2 times daily to remove allergens from the nasal cavities as well as help with mucous clearance (this is especially helpful to do before the nasal sprays are given  - Continue allergy injections per protocol  - Carry epipen  on injection days     Flexural atopic dermatitis : well controlled  - Continue Triamcinolone  0.1% ointment as needed for flares  - Continue daily moistrurizers twice daily as needed   Follow up:  6 month   Thank you so much for letting me partake in your care today.  Don't hesitate to reach out if you have any additional concerns!  Hargis Springer, MD  [facility] [place] [place]

## 2023-09-02 NOTE — Progress Notes (Signed)
 FOLLOW UP Date of Service/Encounter:  09/02/23  Subjective:  Jeffrey Duran (DOB: 30-Jul-2001) is a 22 y.o. male who [facility] in re-evaluation of the following: acute visit for drainage and sore throat History obtained from: chart review and patient.  For Review, LV was on  05/07/23  with Dr. Lorin seen for routine follow-up for asthma, allergic rhinitis, eczema, eustachian tube dysfunction  See below for summary of history and diagnostics.   Therapeutic plans/changes recommended: Exacerbation of allergic rhinitis and was treated with Depo-Medrol  40 mg IM ----------------------------------------------------- Pertinent History/Diagnostics:  Asthma: Current meds: QVAR  80 mcg 1 puff daily, singulair  10 mg daily. Allergic Rhinitis:  ear fullness, nasal comngestion  Previous allergy testing: positive to grasses, weeds, trees, indoor molds, outdoor molds, dust mites, cat and dog On AIT since 02/14/2022.  Reached 0.5 mL of red vial on 01/06/2023. has been evaluated by ENT for his persistent left eustachian tube dysfunction. He was treated for otitis externa and switched from nasal Atrovent  to Nasonex   Denies any LR/SR with AIT  Current meds: Zyrtec  10 mg daily, Singulair  10 mg daily, Dymista  nasal spray Eczema:  flares mostly arms, neck  -current regimen triamcinolone  0.1%  --------------------------------------------------- Today presents for acute visit for drainage and sore throat. He is taking Qvar  as needed for flares.  Only needing for a few days every few months  No ABX/steroids since last visit He is on his daily montelukast , cetirizine   Rare Am nasal congestion, otherwise rhinitis is well controlled  He is due for his allergy injection today. AD has slightly worsened with colder weather.  Will need topical steroids 3 times per week.   All medications reviewed by clinical staff and updated in chart. No new pertinent medical or surgical  history except as noted in HPI.  ROS: All others negative except as noted per HPI.   Objective:  BP 110/70   Pulse 84   Temp (!) 97 F (36.1 C) (Temporal)   Resp 18   SpO2 98%  There is no height or weight on file to calculate BMI. Physical Exam: General Appearance:  Alert, cooperative, no distress, appears stated age  Head:  Normocephalic, without obvious abnormality, atraumatic  Eyes:  Conjunctiva clear, EOM's intact  Ears EACs normal bilaterally and normal TMs bilaterally  Nose: Nares normal, normal mucosa and no visible anterior polyps  Throat: Lips, tongue normal; teeth and gums normal, normal posterior oropharynx  Neck: Supple, symmetrical  Lungs:   clear to auscultation bilaterally, Respirations unlabored, no coughing  Heart:  regular rate and rhythm and no murmur, Appears well perfused  Extremities: No edema  Skin: Skin color, texture, turgor normal and no rashes or lesions on visualized portions of skin  Neurologic: No gross deficits   Labs:  Lab Orders  No laboratory test(s) ordered today   Assessment/Plan  Mild Persistent  Asthma: well controlled PLAN: . - Daily controller medication(s): Qvar  80mcg 1 puff  daily as block therapy  - Prior to physical activity: albuterol  2 puffs 10-15 minutes before physical activity. - Rescue medications: albuterol  4 puffs every 4-6 hours as needed - Get Influenza Vaccine and appropriate Pneumonia and COVID 19 boosters  - Asthma control goals:  * Full participation in all desired activities (may need albuterol  before activity) * Albuterol  use two time or less a week on average (not counting use with activity) * Cough interfering with sleep two time or less a month * Oral steroids no more than  once a year * No hospitalizations     Seasonal and perennial allergic rhinitis: well controlled  - Continue with: Zyrtec  (cetirizine ) 10mg  tablet once daily and Singulair  (montelukast ) 10mg  daily - You can use an extra dose of the  antihistamine, if needed, for breakthrough symptoms.  - Use nasal saline rinses 1-2 times daily to remove allergens from the nasal cavities as well as help with mucous clearance (this is especially helpful to do before the nasal sprays are given  - Continue allergy injections per protocol  - Carry epipen  on injection days     Flexural atopic dermatitis : well controlled  - Continue Triamcinolone  0.1% ointment as needed for flares  - Continue daily moistrurizers twice daily as needed   Follow up:  6 month   Thank you so much for letting me partake in your care today.  Don't hesitate to reach out if you have any additional concerns!  Hargis Springer, MD  [facility] [place], [place]

## 2023-09-09 ENCOUNTER — Ambulatory Visit (INDEPENDENT_AMBULATORY_CARE_PROVIDER_SITE_OTHER): Payer: BC Managed Care – PPO

## 2023-09-09 DIAGNOSIS — J309 Allergic rhinitis, unspecified: Secondary | ICD-10-CM

## 2023-09-30 ENCOUNTER — Other Ambulatory Visit: Payer: Self-pay | Admitting: Internal Medicine

## 2023-10-10 ENCOUNTER — Ambulatory Visit (INDEPENDENT_AMBULATORY_CARE_PROVIDER_SITE_OTHER): Payer: BC Managed Care – PPO

## 2023-10-10 DIAGNOSIS — J309 Allergic rhinitis, unspecified: Secondary | ICD-10-CM

## 2023-11-07 ENCOUNTER — Ambulatory Visit (INDEPENDENT_AMBULATORY_CARE_PROVIDER_SITE_OTHER): Payer: Self-pay

## 2023-11-07 DIAGNOSIS — J309 Allergic rhinitis, unspecified: Secondary | ICD-10-CM

## 2023-12-08 ENCOUNTER — Ambulatory Visit (INDEPENDENT_AMBULATORY_CARE_PROVIDER_SITE_OTHER): Payer: Self-pay

## 2023-12-08 DIAGNOSIS — J309 Allergic rhinitis, unspecified: Secondary | ICD-10-CM | POA: Diagnosis not present

## 2024-01-08 DIAGNOSIS — J3081 Allergic rhinitis due to animal (cat) (dog) hair and dander: Secondary | ICD-10-CM

## 2024-01-08 NOTE — Progress Notes (Signed)
 VIAL MADE 01-08-24

## 2024-01-09 ENCOUNTER — Ambulatory Visit (INDEPENDENT_AMBULATORY_CARE_PROVIDER_SITE_OTHER): Payer: Self-pay | Admitting: *Deleted

## 2024-01-09 DIAGNOSIS — J309 Allergic rhinitis, unspecified: Secondary | ICD-10-CM

## 2024-02-11 ENCOUNTER — Ambulatory Visit (INDEPENDENT_AMBULATORY_CARE_PROVIDER_SITE_OTHER): Payer: Self-pay

## 2024-02-11 DIAGNOSIS — J309 Allergic rhinitis, unspecified: Secondary | ICD-10-CM | POA: Diagnosis not present

## 2024-03-08 ENCOUNTER — Ambulatory Visit (INDEPENDENT_AMBULATORY_CARE_PROVIDER_SITE_OTHER): Payer: Self-pay

## 2024-03-08 DIAGNOSIS — J309 Allergic rhinitis, unspecified: Secondary | ICD-10-CM | POA: Diagnosis not present

## 2024-04-06 ENCOUNTER — Ambulatory Visit (INDEPENDENT_AMBULATORY_CARE_PROVIDER_SITE_OTHER): Payer: Self-pay

## 2024-04-06 DIAGNOSIS — J309 Allergic rhinitis, unspecified: Secondary | ICD-10-CM

## 2024-04-20 ENCOUNTER — Ambulatory Visit (INDEPENDENT_AMBULATORY_CARE_PROVIDER_SITE_OTHER)

## 2024-04-20 DIAGNOSIS — J309 Allergic rhinitis, unspecified: Secondary | ICD-10-CM | POA: Diagnosis not present

## 2024-04-27 ENCOUNTER — Ambulatory Visit (INDEPENDENT_AMBULATORY_CARE_PROVIDER_SITE_OTHER): Payer: Self-pay

## 2024-04-27 DIAGNOSIS — J309 Allergic rhinitis, unspecified: Secondary | ICD-10-CM

## 2024-05-06 ENCOUNTER — Ambulatory Visit (INDEPENDENT_AMBULATORY_CARE_PROVIDER_SITE_OTHER): Payer: Self-pay

## 2024-05-06 DIAGNOSIS — J309 Allergic rhinitis, unspecified: Secondary | ICD-10-CM

## 2024-06-01 ENCOUNTER — Telehealth: Payer: Self-pay

## 2024-06-01 ENCOUNTER — Ambulatory Visit (INDEPENDENT_AMBULATORY_CARE_PROVIDER_SITE_OTHER)

## 2024-06-01 DIAGNOSIS — J309 Allergic rhinitis, unspecified: Secondary | ICD-10-CM

## 2024-06-01 NOTE — Telephone Encounter (Signed)
 Patient is wanting to know when to come back for injection and informed to come in this week. If he is starting a new vial they will inform him when to return for next dose.

## 2024-06-29 ENCOUNTER — Ambulatory Visit: Payer: Self-pay

## 2024-06-29 DIAGNOSIS — J309 Allergic rhinitis, unspecified: Secondary | ICD-10-CM

## 2024-08-06 ENCOUNTER — Ambulatory Visit

## 2024-08-06 DIAGNOSIS — J309 Allergic rhinitis, unspecified: Secondary | ICD-10-CM

## 2024-09-06 DIAGNOSIS — J302 Other seasonal allergic rhinitis: Secondary | ICD-10-CM

## 2024-09-28 ENCOUNTER — Ambulatory Visit (INDEPENDENT_AMBULATORY_CARE_PROVIDER_SITE_OTHER)

## 2024-09-28 DIAGNOSIS — J302 Other seasonal allergic rhinitis: Secondary | ICD-10-CM
